# Patient Record
Sex: Female | Born: 1995 | Race: Black or African American | Hispanic: No | Marital: Single | Smoking: Never smoker
Health system: Southern US, Community
[De-identification: ages and names within clinical notes are randomized; demographics above are authoritative.]

## PROBLEM LIST (undated history)

## (undated) DIAGNOSIS — E559 Vitamin D deficiency, unspecified: Secondary | ICD-10-CM

## (undated) DIAGNOSIS — R519 Headache, unspecified: Secondary | ICD-10-CM

## (undated) DIAGNOSIS — F32A Depression, unspecified: Secondary | ICD-10-CM

## (undated) DIAGNOSIS — K429 Umbilical hernia without obstruction or gangrene: Secondary | ICD-10-CM

## (undated) DIAGNOSIS — Z8719 Personal history of other diseases of the digestive system: Secondary | ICD-10-CM

## (undated) DIAGNOSIS — J45909 Unspecified asthma, uncomplicated: Secondary | ICD-10-CM

## (undated) DIAGNOSIS — F419 Anxiety disorder, unspecified: Secondary | ICD-10-CM

## (undated) DIAGNOSIS — K219 Gastro-esophageal reflux disease without esophagitis: Secondary | ICD-10-CM

## (undated) HISTORY — PX: TONSILLECTOMY: SUR1361

## (undated) HISTORY — PX: WISDOM TOOTH EXTRACTION: SHX21

---
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
## 2018-02-27 DIAGNOSIS — M722 Plantar fascial fibromatosis: Secondary | ICD-10-CM | POA: Insufficient documentation

## 2018-03-19 DIAGNOSIS — M228X2 Other disorders of patella, left knee: Secondary | ICD-10-CM | POA: Insufficient documentation

## 2018-09-17 HISTORY — PX: KNEE ARTHROSCOPY W/ LATERAL RELEASE: SHX1873

## 2018-10-18 DIAGNOSIS — F339 Major depressive disorder, recurrent, unspecified: Secondary | ICD-10-CM | POA: Insufficient documentation

## 2021-12-12 ENCOUNTER — Ambulatory Visit (INDEPENDENT_AMBULATORY_CARE_PROVIDER_SITE_OTHER): Payer: Medicaid Other | Admitting: Gastroenterology

## 2021-12-12 ENCOUNTER — Encounter: Payer: Self-pay | Admitting: Gastroenterology

## 2021-12-12 ENCOUNTER — Other Ambulatory Visit: Payer: Self-pay

## 2021-12-12 VITALS — BP 128/84 | HR 73 | Temp 98.7°F | Ht 62.0 in | Wt 192.0 lb

## 2021-12-12 DIAGNOSIS — K219 Gastro-esophageal reflux disease without esophagitis: Secondary | ICD-10-CM | POA: Diagnosis not present

## 2021-12-12 MED ORDER — PANTOPRAZOLE SODIUM 40 MG PO TBEC: 40.0000 mg | DELAYED_RELEASE_TABLET | Freq: Every day | ORAL | 3 refills | Status: DC

## 2021-12-12 NOTE — Progress Notes (Signed)
? ? ?Gastroenterology Consultation ? ?Referring Provider:     Ronal Fear, PA-C ?Primary Care Physician:  No primary care provider on file. ?Primary Gastroenterologist:  Dr. Servando Snare     ?Reason for Consultation:     GERD ?      ? HPI:   ?April Foster is a 26 y.o. y/o female referred for consultation & management of GERD. by Dr. Bonnetta Barry primary care provider on file..  This patient comes in to see me today after being seen in the past that [facility] for GERD.  The patient underwent an upper endoscopy without any significant findings.  The patient reports that she has symptoms of regurgitation with burning of her mouth and heartburn.  The patient was put on omeprazole with very little relief.  The patient also states that she was told she had a hiatal hernia.  There is no report of any black stools or bloody stools.  The patient also denies any unexplained weight loss.  She does report that some of her symptoms are worse when she lays down at night.  The patient felt that she was not taken seriously with her previous gastroenterologist.  The patient denies any food getting stuck in her esophagus or choking when she eats. ? ?No past medical history on file. ? ? ? ?Prior to Admission medications   ?Medication Sig Start Date End Date Taking? Authorizing Provider  ?ARIPiprazole (ABILIFY) 2 MG tablet Take 2 mg by mouth at bedtime. 10/15/21  Yes [provider]  ?FLUoxetine (PROZAC) 40 MG capsule Take 40 mg by mouth daily. 10/15/21  Yes [provider]  ?norethindrone-ethinyl estradiol-iron (LOESTRIN FE) 1.5-30 MG-MCG tablet Take by mouth.   Yes [provider]  ? ? ?No family history on file.  ? ?Social History  ? ?Tobacco Use  ? Smoking status: Never  ? Smokeless tobacco: Never  ? ? ?Allergies as of 12/12/2021 - Review Complete 12/12/2021  ?Allergen Reaction Noted  ? Cholecalciferol Rash 03/18/2019  ? Bupropion Hives 02/18/2019  ? Ibuprofen Nausea And Vomiting 06/06/2016   ? ? ?Review of Systems:    ?All systems reviewed and negative except where noted in HPI. ? ? Physical Exam:  ?BP 128/84   Pulse 73   Temp 98.7 ?F (37.1 ?C) (Oral)   Ht 5\' 2"  (1.575 m)   Wt 192 lb (87.1 kg)   BMI 35.12 kg/m?  ?No LMP recorded. ?General:   Alert,  Well-developed, well-nourished, pleasant and cooperative in NAD ?Head:  Normocephalic and atraumatic. ?Eyes:  Sclera clear, no icterus.   Conjunctiva pink. ?Ears:  Normal auditory acuity. ?Neck:  Supple; no masses or thyromegaly. ?Lungs:  Respirations even and unlabored.  Clear throughout to auscultation.   No wheezes, crackles, or rhonchi. No acute distress. ?Heart:  Regular rate and rhythm; no murmurs, clicks, rubs, or gallops. ?Abdomen:  Normal bowel sounds.  No bruits.  Soft, Mild epigastric tenderness and non-distended without masses, hepatosplenomegaly or hernias noted.  No guarding or rebound tenderness.  Negative Carnett sign.   ?Rectal:  Deferred.  ?Pulses:  Normal pulses noted. ?Extremities:  No clubbing or edema.  No cyanosis. ?Neurologic:  Alert and oriented x3;  grossly normal neurologically. ?Skin:  Intact without significant lesions or rashes.  No jaundice. ?Lymph Nodes:  No significant cervical adenopathy. ?Psych:  Alert and cooperative. Normal mood and affect. ? ?Imaging Studies: ?No results found. ? ?Assessment and Plan:  ? ?April Foster is a 26 y.o. y/o female who comes in today with  a history of having an EGD with a hiatal hernia and reports that she is not doing any better on her omeprazole.  The patient will be started on pantoprazole 40 mg a day.  The patient has been told that if her symptoms are worse at night she should take the pantoprazole in the evening.  The patient has also been told that she does not need a repeat EGD at this time. If her symptoms do not improve on the PPI then she may need esophageal pH monitoring to see if she is actually having acid reflux despite taking medication.  If she does get relief from the  PPI she has also been told that she may elect to follow up with surgery for possible antireflux surgery.  The patient has been explained the plan and agrees with it. ? ? ? ?Midge Minium, MD. Clementeen Graham ? ? ? Note: This dictation was prepared with Dragon dictation along with smaller phrase technology. Any transcriptional errors that result from this process are unintentional.   ?

## 2021-12-27 ENCOUNTER — Encounter: Payer: Self-pay | Admitting: Gastroenterology

## 2021-12-27 DIAGNOSIS — K219 Gastro-esophageal reflux disease without esophagitis: Secondary | ICD-10-CM

## 2022-01-03 ENCOUNTER — Telehealth: Payer: Self-pay | Admitting: Gastroenterology

## 2022-01-03 NOTE — Telephone Encounter (Signed)
Patient called stating that she had left Dr Servando Snare a message in [place] on 12/27/2021 and has not received a reply. Patient is requesting a call back from Dr Servando Snare or his nurse as soon as possible.  ?

## 2022-01-04 NOTE — Telephone Encounter (Signed)
Please see [email or web address]

## 2022-01-22 NOTE — Addendum Note (Signed)
Addended by: Roena Malady on: 01/22/2022 09:36 AM ? ? Modules accepted: Orders ? ?

## 2022-01-30 ENCOUNTER — Other Ambulatory Visit: Payer: Self-pay

## 2022-01-30 ENCOUNTER — Emergency Department
Admission: EM | Admit: 2022-01-30 | Discharge: 2022-01-30 | Disposition: A | Discharge status: Home / Self Care | Payer: Medicaid Other | Attending: Emergency Medicine | Admitting: Emergency Medicine

## 2022-01-30 ENCOUNTER — Emergency Department: Payer: Medicaid Other

## 2022-01-30 DIAGNOSIS — R1013 Epigastric pain: Secondary | ICD-10-CM | POA: Insufficient documentation

## 2022-01-30 DIAGNOSIS — R112 Nausea with vomiting, unspecified: Secondary | ICD-10-CM | POA: Diagnosis not present

## 2022-01-30 LAB — COMPREHENSIVE METABOLIC PANEL
ALT: 23 U/L (ref 0–44)
AST: 26 U/L (ref 15–41)
Albumin: 4.2 g/dL (ref 3.5–5.0)
Alkaline Phosphatase: 76 U/L (ref 38–126)
Anion gap: 9 (ref 5–15)
BUN: 16 mg/dL (ref 6–20)
CO2: 23 mmol/L (ref 22–32)
Calcium: 9.2 mg/dL (ref 8.9–10.3)
Chloride: 107 mmol/L (ref 98–111)
Creatinine, Ser: 0.9 mg/dL (ref 0.44–1.00)
GFR, Estimated: >60 mL/min (ref >60)
Glucose, Bld: 106 mg/dL — ABNORMAL HIGH (ref 70–99)
Potassium: 4.1 mmol/L (ref 3.5–5.1)
Sodium: 139 mmol/L (ref 135–145)
Total Bilirubin: 0.8 mg/dL (ref 0.3–1.2)
Total Protein: 7.6 g/dL (ref 6.5–8.1)

## 2022-01-30 LAB — CBC WITH DIFFERENTIAL/PLATELET
Abs Immature Granulocytes: 0.03 10*3/uL (ref 0.00–0.07)
Basophils Absolute: 0 10*3/uL (ref 0.0–0.1)
Basophils Relative: 0 %
Eosinophils Absolute: 0.1 10*3/uL (ref 0.0–0.5)
Eosinophils Relative: 1 %
HCT: 43.9 % (ref 36.0–46.0)
Hemoglobin: 14.9 g/dL (ref 12.0–15.0)
Immature Granulocytes: 0 %
Lymphocytes Relative: 7 %
Lymphs Abs: 0.7 10*3/uL (ref 0.7–4.0)
MCH: 29.5 pg (ref 26.0–34.0)
MCHC: 33.9 g/dL (ref 30.0–36.0)
MCV: 86.9 fL (ref 80.0–100.0)
Monocytes Absolute: 0.8 10*3/uL (ref 0.1–1.0)
Monocytes Relative: 8 %
Neutro Abs: 8.7 10*3/uL — ABNORMAL HIGH (ref 1.7–7.7)
Neutrophils Relative %: 84 %
Platelets: 321 10*3/uL (ref 150–400)
RBC: 5.05 MIL/uL (ref 3.87–5.11)
RDW: 11.7 % (ref 11.5–15.5)
WBC: 10.2 10*3/uL (ref 4.0–10.5)
nRBC: 0 % (ref 0.0–0.2)

## 2022-01-30 LAB — TROPONIN I (HIGH SENSITIVITY): Troponin I (High Sensitivity): <2 ng/L (ref <18)

## 2022-01-30 LAB — URINALYSIS, ROUTINE W REFLEX MICROSCOPIC
Bilirubin Urine: NEGATIVE
Glucose, UA: NEGATIVE mg/dL
Hgb urine dipstick: NEGATIVE
Ketones, ur: NEGATIVE mg/dL
Nitrite: NEGATIVE
Protein, ur: NEGATIVE mg/dL
Specific Gravity, Urine: 1.021 (ref 1.005–1.030)
pH: 8 (ref 5.0–8.0)

## 2022-01-30 LAB — HCG, QUANTITATIVE, PREGNANCY: hCG, Beta Chain, Quant, S: <1 m[IU]/mL (ref <5)

## 2022-01-30 LAB — LIPASE, BLOOD: Lipase: 28 U/L (ref 11–51)

## 2022-01-30 LAB — POC URINE PREG, ED: Preg Test, Ur: NEGATIVE

## 2022-01-30 MED ORDER — SODIUM CHLORIDE 0.9 % IV BOLUS
1000.0000 mL | Freq: Once | INTRAVENOUS | Status: AC
  Administered 2022-01-30: 1000 mL via INTRAVENOUS

## 2022-01-30 MED ORDER — OMEPRAZOLE MAGNESIUM 20 MG PO TBEC: 20.0000 mg | DELAYED_RELEASE_TABLET | Freq: Every day | ORAL | 1 refills | Status: DC

## 2022-01-30 MED ORDER — MORPHINE SULFATE (PF) 4 MG/ML IV SOLN
4.0000 mg | Freq: Once | INTRAVENOUS | Status: AC
  Administered 2022-01-30: 4 mg via INTRAVENOUS
  Filled 2022-01-30: qty 1

## 2022-01-30 MED ORDER — MORPHINE SULFATE (PF) 4 MG/ML IV SOLN
4.0000 mg | Freq: Once | INTRAVENOUS | Status: AC
Start: 2022-01-30 — End: 2022-01-30
  Administered 2022-01-30: 4 mg via INTRAVENOUS
  Filled 2022-01-30: qty 1

## 2022-01-30 MED ORDER — ALUM & MAG HYDROXIDE-SIMETH 200-200-20 MG/5ML PO SUSP
30.0000 mL | Freq: Once | ORAL | Status: AC
  Administered 2022-01-30: 30 mL via ORAL
  Filled 2022-01-30: qty 30

## 2022-01-30 MED ORDER — LIDOCAINE VISCOUS HCL 2 % MT SOLN
15.0000 mL | Freq: Once | OROMUCOSAL | Status: AC
  Administered 2022-01-30: 15 mL via ORAL
  Filled 2022-01-30: qty 15

## 2022-01-30 MED ORDER — ONDANSETRON HCL 4 MG/2ML IJ SOLN
4.0000 mg | Freq: Once | INTRAMUSCULAR | Status: AC
  Administered 2022-01-30: 4 mg via INTRAVENOUS
  Filled 2022-01-30: qty 2

## 2022-01-30 MED ORDER — HYDROCODONE-ACETAMINOPHEN 5-325 MG PO TABS: 1.0000 | ORAL_TABLET | Freq: Four times a day (QID) | ORAL | 0 refills | Status: DC | PRN

## 2022-01-30 MED ORDER — IOHEXOL 300 MG/ML  SOLN
100.0000 mL | Freq: Once | INTRAMUSCULAR | Status: AC | PRN
  Administered 2022-01-30: 100 mL via INTRAVENOUS

## 2022-01-30 NOTE — ED Notes (Signed)
Patient requesting to speak with MD before leaving. MD made aware.  ?

## 2022-01-30 NOTE — ED Notes (Signed)
Pt agrees to notify this RN when urine sample available.  ?

## 2022-01-30 NOTE — ED Triage Notes (Signed)
Pt comes with c/o abdominal pain that started at 3am. Pt states vomiting twice today already. Pt denies any diarrhea. ?

## 2022-01-30 NOTE — ED Notes (Signed)
EDP Malinda verbal order to hold on IV and blood-work until after pt has tried GI cocktail.  ?

## 2022-01-30 NOTE — ED Notes (Signed)
Called lab to add on preg test. State they'll do so now.  ?

## 2022-01-30 NOTE — ED Provider Notes (Signed)
? ?[facility] ?Provider Note ? ? ? Event Date/Time  ? First MD Initiated Contact with Patient 01/30/22 [phone]   ?  (approximate) ? ? ?History  ? ?Abdominal Pain ? ? ?HPI ? ?Mystique Sien is a 26 y.o. female reports onset of upper abdominal epigastric pain about 3:00 this morning with nausea and vomiting but no diarrhea.  The patient is severe and radiates to her back.  It is made worse by the bumps in the road and percussion of her CVA areas.  GI cocktail was given but did not get rid of the pain.  Patient does seem to be less restless however though. ?  ? ? ?Physical Exam  ? ?Triage Vital Signs: ?ED Triage Vitals  ?Enc Vitals Group  ?   BP 01/30/22 0853 120/82  ?   Pulse Rate 01/30/22 0853 (!) 106  ?   Resp 01/30/22 0853 20  ?   Temp 01/30/22 0853 98.1 ?F (36.7 ?C)  ?   Temp Source 01/30/22 0853 Oral  ?   SpO2 01/30/22 0853 97 %  ?   Weight 01/30/22 0853 190 lb (86.2 kg)  ?   Height 01/30/22 0853 5\' 2"  (1.575 m)  ?   Head Circumference --   ?   Peak Flow --   ?   Pain Score 01/30/22 0836 8  ?   Pain Loc --   ?   Pain Edu? --   ?   Excl. in [place]? --   ? ? ?Most recent vital signs: ?Vitals:  ? 01/30/22 1215 01/30/22 1351  ?BP:  110/68  ?Pulse: 92 90  ?Resp: (!) 21 17  ?Temp:  98.4 ?F (36.9 ?C)  ?SpO2: 99% 98%  ? ? ? ?General: Awake, alert, looks uncomfortable ?CV:  Good peripheral perfusion.  Heart regular rate and rhythm no audible murmurs ?Resp:  Normal effort.  Lungs are clear ?Abd:  No distention.  There is diffuse tenderness which is worse in the epigastric area but it is tender throughout with tender to palpation and percussion there is no CVA Khtari tenderness but percussion of the CVA areas causes anterior abdominal pain ?Extremities no edema ? ? ?ED Results / Procedures / Treatments  ? ?Labs ?(all labs ordered are listed, but only abnormal results are displayed) ?Labs Reviewed  ?COMPREHENSIVE METABOLIC PANEL - Abnormal; Notable for the following components:  ?    Result Value  ? Glucose,  Bld 106 (*)   ? All other components within normal limits  ?URINALYSIS, ROUTINE W REFLEX MICROSCOPIC - Abnormal; Notable for the following components:  ? Color, Urine YELLOW (*)   ? APPearance HAZY (*)   ? Leukocytes,Ua TRACE (*)   ? Bacteria, UA RARE (*)   ? All other components within normal limits  ?CBC WITH DIFFERENTIAL/PLATELET - Abnormal; Notable for the following components:  ? Neutro Abs 8.7 (*)   ? All other components within normal limits  ?LIPASE, BLOOD  ?HCG, QUANTITATIVE, PREGNANCY  ?POC URINE PREG, ED  ?TROPONIN I (HIGH SENSITIVITY)  ?TROPONIN I (HIGH SENSITIVITY)  ? ? ? ?EKG ? ?EKG read and interpreted by me shows sinus tachycardia rate of 109 normal axis nonspecific ST-T wave changes throughout. ? ? ?RADIOLOGY ?CT read by radiology films reviewed and interpreted by me does not show any acute disease.  Radiologist saw some fluid in the esophagus. ? ? ?PROCEDURES: ? ?Critical Care performed:  ? ?Procedures ? ? ?MEDICATIONS ORDERED IN ED: ?Medications  ?alum & mag hydroxide-simeth (  MAALOX/MYLANTA) 200-200-20 MG/5ML suspension 30 mL (30 mLs Oral Given 01/30/22 0917)  ?  And  ?lidocaine (XYLOCAINE) 2 % viscous mouth solution 15 mL (15 mLs Oral Given 01/30/22 0917)  ?morphine (PF) 4 MG/ML injection 4 mg (4 mg Intravenous Given 01/30/22 0938)  ?ondansetron [facility]) injection 4 mg (4 mg Intravenous Given 01/30/22 0936)  ?sodium chloride 0.9 % bolus 1,000 mL (0 mLs Intravenous Stopped 01/30/22 1350)  ?morphine (PF) 4 MG/ML injection 4 mg (4 mg Intravenous Given 01/30/22 1106)  ?iohexol (OMNIPAQUE) 300 MG/ML solution 100 mL (100 mLs Intravenous Contrast Given 01/30/22 1119)  ? ? ? ?IMPRESSION / MDM / ASSESSMENT AND PLAN / ED COURSE  ?I reviewed the triage vital signs and the nursing notes. ?Patient's lab work looks normal CT looks normal pain could possibly be from an ulcer.  I could give the patient normal blood count white blood count lipase liver functions lites etc. she has a normal CT and still because good bit  of pain.  I explained this in detail to the patient family.  I detailed how she had no signs of infection and that there are no abscesses pancreas problems gallbladder problems liver problems etc.  She is already on medicine for acid blocker by GI doctor.  I will have her follow-up with the GI doc continue that medicine I given her a little bit of Vicodin may be for severe pain.  Patient will return for increasing pain fever vomiting diarrhea or any other problems.  Patient follow-up with her GI doctor as well. ? ? ? ? ? ? ?FINAL CLINICAL IMPRESSION(S) / ED DIAGNOSES  ? ?Final diagnoses:  ?Epigastric pain  ? ? ? ?Rx / DC Orders  ? ?ED Discharge Orders   ? ?      Ordered  ?  omeprazole (PRILOSEC OTC) 20 MG tablet  Daily,   Status:  Discontinued       ? 01/30/22 1220  ?  HYDROcodone-acetaminophen (NORCO/VICODIN) 5-325 MG tablet  Every 6 hours PRN       ? 01/30/22 1220  ? ?  ?  ? ?  ? ? ? ?Note:  This document was prepared using Dragon voice recognition software and may include unintentional dictation errors. ?  ?Nena Polio, MD ?01/30/22 1436 ? ?

## 2022-01-30 NOTE — ED Notes (Signed)
This tech attempted to draw blood for lab work, Pt began to hyperventilate and move around in chair. Pt inconsolable. This tech informed Pt that the blood work can be postponed until she is in a room. Pt took two puffs of her inhaler and breathing controlled at this time. First Nurse Sam, RN notified and Pt taken to room 18 by this tech.  ?

## 2022-01-30 NOTE — ED Notes (Signed)
Pt up to bedside toilet to provide urine sample.  

## 2022-01-30 NOTE — ED Notes (Signed)
Cardiac monitor applied for accuracy as pulse ox has good waveform for O2 sat but pulse reading sometimes 25-35 then other times in the 250s. Have adjusted pulse ox and it continues to do this while pt is completely relaxed on stretcher. Pt in NSR to ST from 97-105HR on monitor currently.  ?

## 2022-01-30 NOTE — Discharge Instructions (Addendum)
The CAT scan and blood work were normal.  Not sure what is causing the pain.  You may had an ulcer going on.  That would fit the symptoms.  I will give you some pain medication.  Please continue the Protonix that Dr. Daleen Squibb gave you.  Please return for increasing pain or fever or vomiting or if you get lightheaded or feel sicker.  Otherwise please follow-up with your doctor and the gastroenterologist.  Give Dr.Wohl's call and let them know you are having the pain.  They should be able to see you again soon. ?

## 2022-01-30 NOTE — ED Notes (Signed)
Visitors remain with pt; pt's call bell within reach; pt educated about call bell; rail up; stretcher locked low.  ?

## 2022-01-30 NOTE — ED Notes (Signed)
Pt in with throbbing/sharp upper L/R and lower R abdominal pain; reports is tender; is at hernia per pt; has appointment to meet with surgical team soon per pt but was unable to withstand discomfort until then; pt has emesis bag; no vomiting currently noted; pt denies fever; pt's skin dry; pt's resp reg/unlabored; pt reports pain has been constant since it started; pt has fear of needles and states this is why blood hasn't been obtained yet; pt educated about need for blood-work; visitor with pt.  ?

## 2022-01-30 NOTE — ED Notes (Signed)
Pt states "8/10" pain currently; skin dry; resp reg/unlabored; calmly sitting on stretcher; having conversation with visitors in which she is making jokes; in NAD.  ?

## 2022-01-31 ENCOUNTER — Ambulatory Visit (INDEPENDENT_AMBULATORY_CARE_PROVIDER_SITE_OTHER): Payer: Medicaid Other | Admitting: Surgery

## 2022-01-31 ENCOUNTER — Encounter: Payer: Self-pay | Admitting: Surgery

## 2022-01-31 VITALS — BP 110/76 | HR 85 | Temp 98.5°F | Wt 184.4 lb

## 2022-01-31 DIAGNOSIS — R1013 Epigastric pain: Secondary | ICD-10-CM

## 2022-01-31 DIAGNOSIS — K449 Diaphragmatic hernia without obstruction or gangrene: Secondary | ICD-10-CM | POA: Diagnosis not present

## 2022-01-31 DIAGNOSIS — R1011 Right upper quadrant pain: Secondary | ICD-10-CM

## 2022-01-31 NOTE — Patient Instructions (Addendum)
Your Barium Swallow is scheduled for 01/06/2022 at 9:00 am (arrive by 8:45 am) @ [facility].  ? ?Your Ultrasound is scheduled for 02/09/2022 at 9:00 am (arrive by 8:45 am) @ the [facility] on [address]. Nothing to eat or drink after Midnight.  ? ? ?Centralized Scheduling: [phone]  ? ? ?A referral has been placed to [facility] for an Endoscopy. They will call you for an appointment.  ? ?If you have any concerns or questions, please feel free to call our office. See follow up appointment below.  ? ?Hiatal Hernia ? ?A hiatal hernia occurs when part of the stomach slides above the muscle that separates the abdomen from the chest (diaphragm). A person can be born with a hiatal hernia (congenital), or it may develop over time. In almost all cases of hiatal hernia, only the top part of the stomach pushes through the diaphragm. ?Many people have a hiatal hernia with no symptoms. The larger the hernia, the more likely it is that you will have symptoms. In some cases, a hiatal hernia allows stomach acid to flow back into the tube that carries food from your mouth to your stomach (esophagus). This may cause heartburn symptoms. Severe heartburn symptoms may mean that you have developed a condition called gastroesophageal reflux disease (GERD). ?What are the causes? ?This condition is caused by a weakness in the opening (hiatus) where the esophagus passes through the diaphragm to attach to the upper part of the stomach. A person may be born with a weakness in the hiatus, or a weakness can develop over time. ?What increases the risk? ?This condition is more likely to develop in: ?Older people. Age is a major risk factor for a hiatal hernia, especially if you are over the age of 28. ?Pregnant women. ?People who are overweight. ?People who have frequent constipation. ?What are the signs or symptoms? ?Symptoms of this condition usually develop in the form of GERD symptoms. Symptoms  include: ?Heartburn. ?Belching. ?Indigestion. ?Trouble swallowing. ?Coughing or wheezing. ?Sore throat. ?Hoarseness. ?Chest pain. ?Nausea and vomiting. ?How is this diagnosed? ?This condition may be diagnosed during testing for GERD. Tests that may be done include: ?X-rays of your stomach or chest. ?An upper gastrointestinal (GI) series. This is an X-ray exam of your GI tract that is taken after you swallow a chalky liquid that shows up clearly on the X-ray. ?Endoscopy. This is a procedure to look into your stomach using a thin, flexible tube that has a tiny camera and light on the end of it. ?How is this treated? ?This condition may be treated by: ?Dietary and lifestyle changes to help reduce GERD symptoms. ?Medicines. These may include: ?Over-the-counter antacids. ?Medicines that make your stomach empty more quickly. ?Medicines that block the production of stomach acid (H2 blockers). ?Stronger medicines to reduce stomach acid (proton pump inhibitors). ?Surgery to repair the hernia, if other treatments are not helping. ?If you have no symptoms, you may not need treatment. ?Follow these instructions at home: ?Lifestyle and activity ?Do not use any products that contain nicotine or tobacco, such as cigarettes and e-cigarettes. If you need help quitting, ask your health care provider. ?Try to achieve and maintain a healthy body weight. ?Avoid putting pressure on your abdomen. Anything that puts pressure on your abdomen increases the amount of acid that may be pushed up into your esophagus. ?Avoid bending over, especially after eating. ?Raise the head of your bed by putting blocks under the legs. This keeps your head and esophagus  higher than your stomach. ?Do not wear tight clothing around your chest or stomach. ?Try not to strain when having a bowel movement, when urinating, or when lifting heavy objects. ?Eating and drinking ?Avoid foods that can worsen GERD symptoms. These may include: ?Fatty foods, like fried  foods. ?Citrus fruits, like oranges or lemon. ?Other foods and drinks that contain acid, like orange juice or tomatoes. ?Spicy food. ?Chocolate. ?Eat frequent small meals instead of three large meals a day. This helps prevent your stomach from getting too full. ?Eat slowly. ?Do not lie down right after eating. ?Do not eat 1-2 hours before bed. ?Do not drink beverages with caffeine. These include cola, coffee, cocoa, and tea. ?Do not drink alcohol. ?General instructions ?Take over-the-counter and prescription medicines only as told by your health care provider. ?Keep all follow-up visits as told by your health care provider. This is important. ?Contact a health care provider if: ?Your symptoms are not controlled with medicines or lifestyle changes. ?You are having trouble swallowing. ?You have coughing or wheezing that will not go away. ?Get help right away if: ?Your pain is getting worse. ?Your pain spreads to your arms, neck, jaw, teeth, or back. ?You have shortness of breath. ?You sweat for no reason. ?You feel sick to your stomach (nauseous) or you vomit. ?You vomit blood. ?You have bright red blood in your stools. ?You have black, tarry stools. ?Summary ?A hiatal hernia occurs when part of the stomach slides above the muscle that separates the abdomen from the chest (diaphragm). ?A person may be born with a weakness in the hiatus, or a weakness can develop over time. ?Symptoms of hiatal hernia may include heartburn, trouble swallowing, or sore throat. ?Management of hiatal hernia includes eating frequent small meals instead of three large meals a day. ?Get help right away if you vomit blood, have bright red blood in your stools, or have black, tarry stools. ?This information is not intended to replace advice given to you by your health care provider. Make sure you discuss any questions you have with your health care provider. ?Document Revised: 07/18/2021 Document Reviewed: 08/04/2020 ?Elsevier Patient Education  ? 2023 Elsevier Inc. ? ?

## 2022-02-01 ENCOUNTER — Telehealth: Payer: Self-pay | Admitting: Surgery

## 2022-02-01 NOTE — Progress Notes (Signed)
Patient ID: April Foster, female   DOB: [birth year], 26 y.o.   MRN: [id number]  HPI April Foster is a 26 y.o. female seen in consultation at the request of Dr. Servando Snare.  For hiatal hernia and significant reflux.  Please note that GI has placed her on PPI with improvement of reflux symptoms.  She experiences heartburn that worsen when she lies supine.  Most recently couple days ago she went to the emergency room with severe epigastric pain, she thought this worse related to her hernia.  Pain woke her from sleep and was associated with nausea.  Pain lasted a few hours and think Better after receiving narcotics.  She also got a GI cocktail.  Pain was severe and sharp.  Work-up in the ER including a CT scan of the abdomen pelvis that have personally reviewed showing evidence of a small sliding hiatal hernia. Of note she did have an EGD about a year or so ago at outside facility [facility])  revealing evidence of a hiatal hernia. Her CBC, lipase and CMP were completely normal She is otherwise healthy and is able to perform more than 4 METS of activity without any shortness of breath or chest pain. SHe comes accompanied by mother who has raised her.   HPI  PMHx GERD  No past surgical history on file.  Fam HX significant for GB issues grandmother and other family members.  Social History Social History   Tobacco Use   Smoking status: Never   Smokeless tobacco: Never    Allergies  Allergen Reactions   Cholecalciferol Rash   Bupropion Hives   Ibuprofen Nausea And Vomiting    Current Outpatient Medications  Medication Sig Dispense Refill   ARIPiprazole (ABILIFY) 2 MG tablet Take 2 mg by mouth at bedtime.     FLUoxetine (PROZAC) 40 MG capsule Take 40 mg by mouth daily.     HYDROcodone-acetaminophen (NORCO/VICODIN) 5-325 MG tablet Take 1 tablet by mouth every 6 (six) hours as needed for moderate pain. 10 tablet 0   norethindrone-ethinyl estradiol-iron (LOESTRIN FE) 1.5-30 MG-MCG tablet Take by mouth.      pantoprazole (PROTONIX) 40 MG tablet Take 1 tablet (40 mg total) by mouth daily. 30 tablet 3   No current facility-administered medications for this visit.     Review of Systems Full ROS  was asked and was negative except for the information on the HPI  Physical Exam Blood pressure 110/76, pulse 85, temperature 98.5 F (36.9 C), weight 184 lb 6.4 oz (83.6 kg), SpO2 100 %. CONSTITUTIONAL: NAD. EYES: Pupils are equal, round,  Sclera are non-icteric. EARS, NOSE, MOUTH AND THROAT:  The oral mucosa is pink and moist. Hearing is intact to voice. LYMPH NODES:  Lymph nodes in the neck are normal. RESPIRATORY:  Lungs are clear. There is normal respiratory effort, with equal breath sounds bilaterally, and without pathologic use of accessory muscles. CARDIOVASCULAR: Heart is regular without murmurs, gallops, or rubs. GI: The abdomen is  soft, nontender, and nondistended. There are no palpable masses. There is no hepatosplenomegaly. There are normal bowel sounds in all quadrants. GU: Rectal deferred.   MUSCULOSKELETAL: Normal muscle strength and tone. No cyanosis or edema.   SKIN: Turgor is good and there are no pathologic skin lesions or ulcers. NEUROLOGIC: Motor and sensation is grossly normal. Cranial nerves are grossly intact. PSYCH:  Oriented to person, place and time. Affect is normal.  Data Reviewed  I have personally reviewed the patient's imaging, laboratory findings and medical records.  Assessment/Plan 26 year old female with epigastric pain symptoms more consistent with biliary colic.  In addition she has have evidence of a hiatal hernia with reflux but I do think that they are 2 separate issues. I would like to start further work-up with right upper quadrant ultrasound to evaluate gallbladder and stones.  She does have a strong family history of gallstones. Currently she is not toxic nor septic and does not need to be hospitalized.  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  Discussed with her diet modification and exercise to optimize her BMI as well. I will see her back when she completes her work-up. I spent 60 minutes in this encounter including personally reviewing imaging studies, medical records, counseling the patient and family, placing orders and performing appropriate patient  Sterling Big, MD FACS General Surgeon 02/01/2022, 7:21 AM

## 2022-02-01 NOTE — Telephone Encounter (Signed)
The patient was referred to [facility] just to have an upper endoscopy done for work up prior to hiatal hernia surgery. I have sent them a message about this to get the patient in sooner to have this done.

## 2022-02-01 NOTE — Telephone Encounter (Signed)
Patient was seen on 01/31/22 for hiatal hernia.  We had placed a referral for her to go back to [facility] for additional workup. Patient states that she was just referred to [place] from them and is not quite sure why she needs to go back.  She further states the soonest appointment they have in [place] is in August and [place] is further out from that.  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  Please call her.  Thank you.

## 2022-02-05 ENCOUNTER — Ambulatory Visit: Payer: Medicaid Other

## 2022-02-09 ENCOUNTER — Ambulatory Visit: Payer: Medicaid Other

## 2022-02-09 ENCOUNTER — Ambulatory Visit
Admission: RE | Admit: 2022-02-09 | Discharge: 2022-02-09 | Disposition: A | Discharge status: Home / Self Care | Payer: Medicaid Other | Source: Ambulatory Visit | Attending: Surgery | Admitting: Surgery

## 2022-02-09 DIAGNOSIS — R1011 Right upper quadrant pain: Secondary | ICD-10-CM | POA: Insufficient documentation

## 2022-02-09 DIAGNOSIS — K449 Diaphragmatic hernia without obstruction or gangrene: Secondary | ICD-10-CM | POA: Diagnosis present

## 2022-02-13 ENCOUNTER — Telehealth: Payer: Self-pay

## 2022-02-13 NOTE — Telephone Encounter (Signed)
Left message that scan showed known hital hernia-keep scheduled appointment with Dr.Pabon.

## 2022-02-21 ENCOUNTER — Encounter: Payer: Self-pay | Admitting: Surgery

## 2022-02-21 ENCOUNTER — Ambulatory Visit: Payer: Medicaid Other | Admitting: Surgery

## 2022-02-21 VITALS — BP 115/80 | HR 83 | Temp 98.4°F | Ht 62.0 in | Wt 188.0 lb

## 2022-02-21 DIAGNOSIS — K449 Diaphragmatic hernia without obstruction or gangrene: Secondary | ICD-10-CM

## 2022-02-21 NOTE — Patient Instructions (Signed)
We have spoken today about repairing your Hiatal Hernia. Your surgery has been scheduled for 03/29/2022 with Dr. Everlene Farrier.  Plan to be in the hospital for 1-2 days if the minimally invasive surgery is completed without having to make a bigger incision. If the bigger incision is made, you will most likely need to be in the hospital 4-6 days. You will be on a liquid diet and need to recover for 2 weeks following your surgery prior to doing any of your normal activities. At the 2 week mark, we will see you in the office and if you are doing ok we will advance your diet and activity level as you tolerate.  Please see your Blue (Pre-care) Sheet for more information regarding your surgery. Our surgery scheduler will call you to verify surgery date and to go over information  If you have FMLA or disability paperwork that needs filled out you may drop this off at our office or this can be faxed to [phone].  Please call our office with any questions or concerns that you have regarding your surgery and recovery.     Laparoscopic Nissen Fundoplication Laparoscopic Nissen fundoplication is surgery to relieve heartburn and other problems caused by gastric fluids flowing up into your esophagus. The esophagus is the tube that carries food and liquid from your throat to your stomach. Normally, the muscle that sits between your stomach and your esophagus (lower esophageal sphincter or LES) keeps stomach fluids in your stomach. In some people, the LES does not work properly, and stomach fluids flow up into the esophagus. This can happen when part of the stomach bulges through the LES (hiatal hernia). The backward flow of stomach fluids can cause a type of severe and long-standing heartburn that is called gastroesophageal reflux disease (GERD). You may need this surgery if other treatments for GERD have not helped. In a laparoscopic Nissen fundoplication, the upper part of your stomach is wrapped around the lower  part of your esophagus to strengthen the LES and prevent reflux. If you have a hiatal hernia, it will also be repaired with this surgery. The procedure is done through several small incisions in your abdomen. It is performed using a thin, telescopic instrument (laparoscope) and other instruments that can pass through the scope or through other small incisions. Tell a health care provider about: Any allergies you have. All medicines you are taking, including vitamins, herbs, eye drops, creams, and over-the-counter medicines. Any problems you or family members have had with anesthetic medicines. Any blood disorders you have. Any surgeries you have had. Any medical conditions you have. What are the risks? Generally, this is a safe procedure. However, problems may occur, including: Difficulty swallowing (dysphagia). Bloating. Nausea or vomiting. Damage to the lung, causing a collapsed lung. Infection or bleeding. What happens before the procedure? Ask your health care provider about: Changing or stopping your regular medicines. This is especially important if you are taking diabetes medicines or blood thinners. Taking medicines such as aspirin and ibuprofen. These medicines can thin your blood. Do not take these medicines before your procedure if your health care provider asks you not to. Follow your health care provider's instructions about eating or drinking restrictions. Plan to have someone take you home after the procedure. What happens during the procedure? An IV tube will be inserted into one of your veins. It will be used to give you fluids and medicines during the procedure. You will be given a medicine that makes you fall asleep (  general anesthetic). Your abdomen will be cleaned with a germ-killing solution (antiseptic). The surgeon will make a small incision in your abdomen and insert a tube through the incision. Your abdomen will be filled with a gas. This helps the surgeon to see  your organs more easily and it makes more space to work. The surgeon will insert the laparoscope through the incision. The scope has a camera that will send pictures to a monitor in the operating room. The surgeon will make several other small incisions in your abdomen to insert the other instruments that are needed during the procedure. Another instrument (dilator) will be passed through your mouth and down your esophagus into the upper part of your stomach. The dilator will prevent your LES from being closed too tightly during surgery. The surgeon will pass the top portion of your stomach behind the lower part of your esophagus and wrap it all the way around. This will be stitched into place. If you have a hiatal hernia, it will be repaired during this procedure. All instruments will be removed, and your incisions will be closed under your skin with stitches (sutures). Skin adhesive strips may also be used. A bandage (dressing) will be placed on your skin over the incisions. The procedure may vary among health care providers and hospitals. What happens after the procedure? You will be moved to a recovery area. Your blood pressure, heart rate, breathing rate, and blood oxygen level will be monitored often until the medicines you were given have worn off. You will be given pain medicine as needed. Your IV tube will be kept in until you are able to drink fluids. This information is not intended to replace advice given to you by your health care provider. Make sure you discuss any questions you have with your health care provider. Document Released: 09/24/2014 Document Revised: 02/09/2016 Document Reviewed: 05/05/2014 Elsevier Interactive Patient Education  2017 Else[employer].   Laparoscopic Nissen Fundoplication, Care After Refer to this sheet in the next few weeks. These instructions provide you with information about caring for yourself after your procedure. Your health care provider may also  give you more specific instructions. Your treatment has been planned according to current medical practices, but problems sometimes occur. Call your health care provider if you have any problems or questions after your procedure. What can I expect after the procedure? After the procedure, it is common to have: Difficulty swallowing (dysphagia). Excess gas (bloating). Follow these instructions at home: Medicines  Take medicines only as directed by your health care provider. Do not drive or operate heavy machinery while taking pain medicine. Incision care  There are many different ways to close and cover an incision, including stitches (sutures), skin glue, and adhesive strips. Follow your health care provider's instructions about: Incision care. Bandage (dressing) changes and removal. Incision closure removal. Check your incision areas every day for signs of infection. Watch for: Redness, swelling, or pain. Fluid, blood, or pus. Do not take baths, swim, or use a hot tub until your health care provider approves. Take showers as directed by your health care provider. Eating and drinking  Follow your health care provider's instructions about eating. You may need to follow a liquid-only diet for 2 weeks, followed by a diet of soft foods for 2 weeks. You should return to your usual diet gradually. Drink enough fluid to keep your urine clear or pale yellow. Activity  Return to your normal activities as directed by your health care provider. Ask your  health care provider what activities are safe for you. Avoid strenuous exercise. Do not lift anything that is heavier than 10 lb (4.5 kg). Ask your health care provider when you can: Return to sexual activity. Drive. Go back to work. Contact a health care provider if: You have a fever. Your pain gets worse or is not helped by medicine. You have frequent nausea or vomiting. You have continued abdominal bloating. You have an ongoing  (persistent) cough. You have redness, swelling, or pain in any incision areas. You have fluid, blood, or pus coming from any incisions. Get help right away if: You have trouble breathing. You are unable to swallow. You have persistent vomiting. You have blood in your vomit. You have severe abdominal pain. This information is not intended to replace advice given to you by your health care provider. Make sure you discuss any questions you have with your health care provider. Document Released: 04/26/2004 Document Revised: 02/09/2016 Document Reviewed: 05/05/2014 Elsevier Interactive Patient Education  2017 Elsevier Inc.   Diet After Nissen Fundoplication Surgery This diet information is for patients who have recently had Nissen fundoplication surgery to correct reflux disease or to repair various types of hernias, such as hiatal hernia and intrathoracic stomach. This diet may also be used for other gastrointestinal surgeries, such as Heller myotomy and repair of achalasia. The diet will help control diarrhea, excess gas and swallowing problems, which may occur after this type of surgery. Keeping Your Stomach from Stretching Eat small, frequent meals (six to eight per day). This will help you consume the majority of the nutrients you need without causing your stomach to feel full or distended.  Drinking large amounts of fluids with meals can stretch your stomach. You may drink fluids between meals as often as you like, but limit fluids to 1/2 cup (4 fluid ounces) with meals and one cup (8 fluid ounces) with snacks.  Sit upright while eating and stay upright for 30 minutes after each meal. Gravity can help food move through your digestive tract. Do not lie down after eating. Sit upright for 2 hours after your last meal or snack of the day.  Eat very slowly. Take your time when eating.  Take small bites and chew your food well to help aid in swallowing and digestion.  Avoid crusty breads and  sticky, gummy foods, such as bananas, fresh doughy breads, rolls and doughnuts. These types of foods become sticky and difficult to swallow.  Toasted breads tend to be better tolerated.  Lastly, if you eat sweets, consume them at the end of your meal to avoid a group of symptoms referred to as "dumping syndrome". This describes the rapid emptying of foods from the stomach to the small intestine. Sweetened beverages, candy and desserts move more rapidly and dump quickly into the intestines. This can cause symptoms of nausea, weakness, cold sweats, cramps, diarrhea and dizzy spells.  Avoiding Gas Avoid drinking through a straw. Do not chew gum or tobacco. These actions cause you to swallow air, which produces excess gas in your stomach. Chew with your mouth closed.  Avoid any foods that cause stomach gas and distention. These foods include corn, dried beans, peas, lentils, onions, broccoli, cauliflower and any food from the cabbage family.  Avoid carbonated drinks, alcohol, citrus and tomato products.  When will I be able to eat a soft diet? After Nissen fundoplication surgery, your diet will be advanced slowly by your surgeon. Generally, you will be on a clear liquid diet  for the first few meals. Then you will advance to the full liquid diet for a meal or two and eventually to a Nissen soft diet. Please be aware that each patient's tolerance to food is different. Your doctor will advance your diet depending on how well you progress after surgery. Clear Liquid Diet  The first diet after surgery is the clear liquid diet. It includes the following liquids: Apple juice  Cranberry juice  Grape juice  Chicken broth  Beef broth  Flavored gelatin (Jell-O)  Decaf tea and coffee  Caffeinated beverages are permitted based on tolerance  Popsicles  [place] ice Carbonated drinks (sodas) are not allowed for the first six to eight weeks after surgery. After this time you can try them again in small  amounts.  Full Liquid Diet The full liquid diet contains anything on the clear liquid diet, plus: Milk, soy, rice and almond (no chocolate)  Cream of wheat, cream of rice, grits  Strained creamed soups (no tomato or broccoli)  Vanilla and strawberry-flavored ice cream  Sherbet  Blended, custard styled or whipped yogurt (plain or vanilla only)  Vanilla and butterscotch pudding (no chocolate or coconut)  Nutritional drinks including Ensure, Boost, Carnation Instant Breakfast (no chocolate-flavored) Note: Dairy products, such as milk, ice cream and pudding, may cause diarrhea in some people just after surgery. You may need to avoid milk products. If so, substitute them with lactose-free beverages, such as soy, rice, Lactaid or almond milks.  Nissen Soft Diet Food Category Foods to Choose Foods to Avoid  Beverages Milk, such as, whole, 2%, 1%, non-fat, or skim, soy, rice, almond  Caffeinated and decaf tea and coffee  Powdered drink mixes (in moderation)  Non-citrus juices (apple, grape, cranberry or blends of these)  Fruit nectars  Nutritional drinks including Boost, Ensure, Carnation Instant Breakfast Chocolate milk, cocoa or other chocolate-flavored drinks  Carbonated drinks  Alcohol  Citrus juices like orange, grapefruit, lemon and lime  Breads Pancakes, [place] toast and waffles  Crackers (saltine, butter, soda, graham, Goldfish and Cheese Nips)  Toasted bread Untoasted bread, bagels, Kaiser and hard rolls, English muffins  Crackers with nuts, seeds, fresh or dried fruit, coconut, or highly seasoned, such as garlic or onion-flavored  Sweet rolls, coffee cake or doughnuts  Cereals Well cooked cereals, such as oatmeal (plain or flavored)  Cold cereal (Cornflakes, Rice Krispies, Cheerios, Special K plain, Rice Chex and puffed rice) Very coarse cereal, such as bran, shredded wheat  Any cereal with fresh or dried fruit, coconut, seeds or nuts  Desserts Eat in moderation and  do not eat desserts or sweets by themselves. Plain cakes, cookies and cream-filled pies  Vanilla and butterscotch pudding or custard  Ice cream, ice milk, frozen yogurt and sherbet  Gelatin made from allowed foods  Fruit ices and popsicles Desserts containing chocolate, coconut, nuts, seeds, fresh or dried fruit, peppermint or spearmint  Eggs  Poached, hard boiled or scrambled Fried eggs and highly seasoned eggs (deviled eggs)  Fats Eat in moderation. Butter and margarine  Mayonnaise and vegetable oils  Mildly seasoned cream sauces and gravies  Plain cream cheese  Sour cream Highly seasoned salad dressings, cream sauces and gravies  Bacon, bacon fat, ham fat, lard and salt pork  Fried foods  Nuts  Fruits Fruit juice  Any canned or cooked fruit except those listed in the AVOID column ALL fresh fruits, such as citrus, bananas and pineapple  Canned pineapple  Dried fruits, such as raisins, berries  Fruits with  seeds, such as berries, kiwi and figs  Meat, Fish, Poultry, and [employer] may be ground, minced or chopped to ease swallowing and digestion  Tender, well cooked and moist cuts of beef, chicken, [place] and pork  Veal and lamb  Flaky, cooked fish  Canned tuna  Cottage and ricotta cheeses  Mild cheese, such as American, brick, mozzarella and baby Swiss  Creamy peanut butter  Plain custard or blended fruit yogurt  Moist casseroles, such as macaroni & cheese, tuna noodle  Grilled or toasted cheese sandwich Tough meats with a lot of gristle  Fried, highly seasoned, smoked and fatty meat, fish or poultry, such as frankfurters, luncheon meats, sausage, bacon, spare ribs, beef brisket, sardines, anchovies, duck and goose  Chili and other entrees made with pepper or chili pepper  Shellfish  Strongly flavored cheeses, such as sharp cheese, extra sharp cheddar, cheese containing peppers or other seasonings  Crunchy peanut butter  Any yogurt with nuts, seeds, coconut,  strawberries or raspberries  Potatoes and Starches Peeled, mashed or boiled white or sweet potatoes  Oven-baked potatoes without skin  Well cooked white rice, enriched noodles, barley, spaghetti, macaroni and other pastas Fried potatoes, potato skins and potato chips  Hard and soft taco shells  Fried, brown or wild rice  Soups Mildly flavored meat stocks  Cream soups made from allowed foods Highly seasoned soups and tomato based soups, cream soups made with gas producing vegetables, such as broccoli, cauliflower, onion, etc.  Sweets and Snacks Use in moderation and do not eat large amounts of sweets by themselves. Syrup, honey, jelly and seedless jam  Plain hard candies and plain candies made with allowed ingredients  Molasses  Marshmallows  Other candy made from allowed ingredients  Thin pretzels Jam, marmalade and preserves  Chocolate in any form  Any candy containing nuts, coconut, seeds, peppermint, spearmint or dried or fresh fruit  Popcorn, potato chips, tortilla chips  Soft or hard thick pretzels, such as sourdough  Vegetables Well cooked soft vegetables without seeds or skins, such as asparagus tips, beets, carrots, green and wax beans, chopped spinach, tender canned baby peas, squash and pumpkin Raw vegetables, tomatoes, tomato juice, tomato sauce and V-8 juice  Gas producing vegetables, such as broccoli, Brussel sprouts, cabbage, cauliflower, onions, corn, cucumber, green peppers, rutabagas, turnips, radishes and sauerkraut  Dried beans, peas and lentils  Miscellaneous Salt and spices in moderation  Mustard and vinegar in moderation Fried or highly seasoned foods  Coconut and seeds  Pickles and olives  Chili sauces, ketchup, barbecue sauce, horseradish, black pepper, chili powder and onion and garlic seasonings  Any other strongly flavored seasoning, condiment, spice or herb not tolerated  Any food not tolerated

## 2022-02-21 NOTE — Progress Notes (Signed)
Outpatient Surgical Follow Up  02/21/2022  April Foster is an 26 y.o. female.   Chief Complaint  Patient presents with   Follow-up    HPI:  April Foster is a 26 y.o. female seen in consultation at the request of Dr. Servando Snare.  For hiatal hernia and significant reflux.    She experiences heartburn that worsen when she lies supine. She continues to have reflux despite PPI.  She did have an ultrasound of the right upper quadrant revealing no evidence of gallstones and no evidence of biliary pathology.  Her abdominal pain has subsided but she still have significant reflux There is no evidence of dysphagia. she did have an EGD about a year or so ago at outside facility [facility])  revealing evidence of a hiatal hernia. Her CBC, lipase and CMP were completely  He completed CT as well as a barium swallow.  There is evidence of reflux and hiatal hernia no strictures.  Also discussed with the patient the possibility of repeating an EGD.  Patient wishes to go ahead and move forward with repair.  I honestly do not think that an EGD will change anything    No past surgical history on file.  No family history on file.  Social History:  reports that she has never smoked. She has never used smokeless tobacco. No history on file for alcohol use and drug use.  Allergies:  Allergies  Allergen Reactions   Cholecalciferol Rash   Bupropion Hives   Ibuprofen Nausea And Vomiting    Medications reviewed.    ROS Full ROS performed and is otherwise negative other than what is stated in HPI   BP 115/80   Pulse 83   Temp 98.4 F (36.9 C)   Ht 5\' 2"  (1.575 m)   Wt 188 lb (85.3 kg)   LMP 01/24/2022 (Exact Date)   SpO2 99%   BMI 34.39 kg/m   Physical Exam CONSTITUTIONAL: NAD. EYES: Pupils are equal, round,  Sclera are non-icteric. EARS, NOSE, MOUTH AND THROAT:  The oral mucosa is pink and moist. Hearing is intact to voice. LYMPH NODES:  Lymph nodes in the neck are normal. RESPIRATORY:  Lungs are  clear. There is normal respiratory effort, with equal breath sounds bilaterally, and without pathologic use of accessory muscles. CARDIOVASCULAR: Heart is regular without murmurs, gallops, or rubs. GI: The abdomen is  soft, nontender, and nondistended. There are no palpable masses. There is no hepatosplenomegaly. There are normal bowel sounds in all quadrants. GU: Rectal deferred.   MUSCULOSKELETAL: Normal muscle strength and tone. No cyanosis or edema.   SKIN: Turgor is good and there are no pathologic skin lesions or ulcers. NEUROLOGIC: Motor and sensation is grossly normal. Cranial nerves are grossly intact. PSYCH:  Oriented to person, place and time. Affect is normal.  Assessment/Plan: 26 yo female with symptomatic Hiatal hernia.  With the patient in detail about options and she wishes surgical interventions as she has significant reflux that is recalcitrant and she wishes to be off the PPI.  She has suffered reflux since her teenage years and she is done   I do think that next month we will be very reasonable to schedule her for repair of paraesophageal hernia.  She is still committed to diet and exercise and is very motivated. I discussed with her in detail about my thought process.  Proceeded discussed with her.  Risks, benefits and possible complications including but not limited to: Bleeding, infection esophageal and bowel injuries, bloating chronic  pain.  We also talked about postoperative diet modification and postoperative course.   I spent 40 minutes in this encounter including coordination of her care, placing orders, personally reviewing imaging studies and performing appropriate documentation.  Sterling Big, MD [facility] General Surgeon

## 2022-02-22 ENCOUNTER — Telehealth: Payer: Self-pay | Admitting: Surgery

## 2022-02-22 NOTE — Telephone Encounter (Signed)
Patient has been advised of Pre-Admission date/time, COVID Testing date and Surgery date.  Surgery Date: 03/29/22 Preadmission Testing Date: 03/21/22 (phone 1p-5p) Covid Testing Date: Not needed.   Patient has been made aware to call [phone], between 1-3:00pm the day before surgery, to find out what time to arrive for surgery.

## 2022-03-21 ENCOUNTER — Other Ambulatory Visit: Payer: Self-pay

## 2022-03-21 ENCOUNTER — Encounter
Admission: RE | Admit: 2022-03-21 | Discharge: 2022-03-21 | Disposition: A | Discharge status: Home / Self Care | Payer: Medicaid Other | Source: Ambulatory Visit | Attending: Surgery | Admitting: Surgery

## 2022-03-21 DIAGNOSIS — Z01812 Encounter for preprocedural laboratory examination: Secondary | ICD-10-CM

## 2022-03-21 HISTORY — DX: Gastro-esophageal reflux disease without esophagitis: K21.9

## 2022-03-21 HISTORY — DX: Personal history of other diseases of the digestive system: Z87.19

## 2022-03-21 HISTORY — DX: Depression, unspecified: F32.A

## 2022-03-21 HISTORY — DX: Vitamin D deficiency, unspecified: E55.9

## 2022-03-21 HISTORY — DX: Unspecified asthma, uncomplicated: J45.909

## 2022-03-21 HISTORY — DX: Anxiety disorder, unspecified: F41.9

## 2022-03-21 HISTORY — DX: Headache, unspecified: R51.9

## 2022-03-21 NOTE — Patient Instructions (Addendum)
Your procedure is scheduled on: 03/29/22 - Thursday Report to the Registration Desk on the 1st floor of the [facility]. To find out your arrival time, please call [phone] between 1PM - 3PM on: 03/28/22 - Wednesday If your arrival time is 6:00 am, do not arrive prior to that time as the [facility] entrance doors do not open until 6:00 am.  REMEMBER: Instructions that are not followed completely may result in serious medical risk, up to and including death; or upon the discretion of your surgeon and anesthesiologist your surgery may need to be rescheduled.  Do not eat food after midnight the night before surgery.  No gum chewing, lozengers or hard candies.  You may however, drink CLEAR liquids up to 2 hours before you are scheduled to arrive for your surgery. Do not drink anything within 2 hours of your scheduled arrival time.  Clear liquids include: - water  - apple juice without pulp - gatorade (not RED colors) - black coffee or tea (Do NOT add milk or creamers to the coffee or tea) Do NOT drink anything that is not on this list.  TAKE THESE MEDICATIONS THE MORNING OF SURGERY WITH A SIP OF WATER:  - pantoprazole (PROTONIX) 40 MG tablet, (take one the night before and one on the morning of surgery - helps to prevent nausea after surgery.) - FLUoxetine (PROZAC) 40 MG capsule - norethindrone-ethinyl estradiol-iron (LOESTRIN FE) 1.5-30 MG-MCG tablet  One week prior to surgery: Stop Anti-inflammatories (NSAIDS) such as Advil, Aleve, Ibuprofen, Motrin, Naproxen, Naprosyn and Aspirin based products such as Excedrin, Goodys Powder, BC Powder.  Stop ANY OVER THE COUNTER supplements until after surgery.  You may however, continue to take Tylenol if needed for pain up until the day of surgery.  No Alcohol for 24 hours before or after surgery.  No Smoking including e-cigarettes for 24 hours prior to surgery.  No chewable tobacco products for at least 6 hours prior to surgery.  No  nicotine patches on the day of surgery.  Do not use any "recreational" drugs for at least a week prior to your surgery.  Please be advised that the combination of cocaine and anesthesia may have negative outcomes, up to and including death. If you test positive for cocaine, your surgery will be cancelled.  On the morning of surgery brush your teeth with toothpaste and water, you may rinse your mouth with mouthwash if you wish. Do not swallow any toothpaste or mouthwash.  Do not wear jewelry, make-up, hairpins, clips or nail polish.  Do not wear lotions, powders, or perfumes.   Do not shave body from the neck down 48 hours prior to surgery just in case you cut yourself which could leave a site for infection.  Also, freshly shaved skin may become irritated if using the CHG soap.  Contact lenses, hearing aids and dentures may not be worn into surgery.  Do not bring valuables to the hospital. [facility] is not responsible for any missing/lost belongings or valuables.   Notify your doctor if there is any change in your medical condition (cold, fever, infection).  Wear comfortable clothing (specific to your surgery type) to the hospital.  After surgery, you can help prevent lung complications by doing breathing exercises.  Take deep breaths and cough every 1-2 hours. Your doctor may order a device called an Incentive Spirometer to help you take deep breaths. When coughing or sneezing, hold a pillow firmly against your incision with both hands. This is called "  splinting." Doing this helps protect your incision. It also decreases belly discomfort.  If you are being admitted to the hospital overnight, leave your suitcase in the car. After surgery it may be brought to your room.  If you are being discharged the day of surgery, you will not be allowed to drive home. You will need a responsible adult (18 years or older) to drive you home and stay with you that night.   If you are taking public  transportation, you will need to have a responsible adult (18 years or older) with you. Please confirm with your physician that it is acceptable to use public transportation.   Please call the Pre-admissions Testing Dept. at [phone] if you have any questions about these instructions.  Surgery Visitation Policy:  Patients undergoing a surgery or procedure may have two family members or support persons with them as long as the person is not COVID-19 positive or experiencing its symptoms.   Inpatient Visitation:    Visiting hours are 7 a.m. to 8 p.m. Up to four visitors are allowed at one time in a patient room, including children. The visitors may rotate out with other people during the day. One designated support person (adult) may remain overnight.

## 2022-03-26 ENCOUNTER — Ambulatory Visit (INDEPENDENT_AMBULATORY_CARE_PROVIDER_SITE_OTHER): Payer: Medicaid Other | Admitting: Surgery

## 2022-03-26 ENCOUNTER — Encounter: Payer: Self-pay | Admitting: Surgery

## 2022-03-26 VITALS — BP 118/81 | HR 91 | Temp 99.2°F | Wt 182.4 lb

## 2022-03-26 DIAGNOSIS — K449 Diaphragmatic hernia without obstruction or gangrene: Secondary | ICD-10-CM

## 2022-03-26 NOTE — Patient Instructions (Signed)
Please call with any questions or concerns. Eating Plan After Nissen Fundoplication After a Nissen fundoplication procedure, it is common to have some difficulty swallowing. The part of your body that moves food and liquid from your mouth to your stomach (esophagus) will be swollen and may feel tight. It will take several weeks or months for your esophagus and stomach to heal. By following a special eating plan, you can prevent problems such as pain, swelling or pressure in the abdomen (bloating), gas, nausea, or diarrhea. What are tips for following this plan? Cooking Cook all foods until they are soft. Remove skins and seeds from fruits and vegetables before eating. Remove skin and gristle from meats. Grind or finely mince meats before eating. Avoid over-cooking meat. Dry, tough meat is more difficult to swallow. Avoid using oil when cooking, or use only a small amount of oil. Avoid using seasoning when cooking, or use only a small amount of seasoning. Toast bread before eating. This makes it easier to swallow. Meal planning  Eat 6-8 small meals throughout the day. Right after the surgery, have a few meals that are only clear liquids. Clear liquids include: Water. Clear fruit juice, no pulp. Chicken, beef, or vegetable broth. Gelatin. Decaffeinated tea or coffee without milk. Popsicles or shaved ice. Depending on your progress, you may move to a full liquid diet as told by your health care provider. This includes clear liquids and the following: Dairy and alternative milks, such as soy milk. Strained creamed soups. Ice cream or sherbet. Pudding. Nutritional supplement drinks. Yogurt. A few days after surgery, you may be able to start eating a diet of soft foods. You may need to eat according to this plan for several weeks. Do not eat sweets or sweetened drinks at the beginning of a meal. Doing that may cause your stomach to empty faster than it should (dumping  syndrome). Lifestyle Always sit upright when eating or drinking. Eat slowly. Take small bites and chew food well before swallowing. Do not lie down after eating. Stay sitting up for 30 minutes or longer after each meal. Sip fluids between meals. Limit how much you drink at one time. With meals and snacks, have 4-8 oz (120-240 mL). This is equal to  cup-1 cup. Do not mix solid foods and liquids in the same mouthful. Drink enough fluid to keep your urine pale yellow. Do not chew gum or drink fluids through a straw. Doing those things may cause you to swallow extra air. General information Do not drink carbonated drinks or alcohol. Avoid foods and drinks that contain caffeine and chocolate. Avoid foods and drinks that contain citrus or tomato. Allow hot soups and drinks to cool before eating. Avoid foods that cause gas, such as beans, peas, broccoli, or cabbage. If dairy milk products cause diarrhea, avoid them or eat them in small amounts. Recommended foods Fruits Any soft-cooked fruits after skins and seeds are removed. Fruit juice. Vegetables Any soft-cooked vegetables after skins and seeds are removed. Vegetable juice. Grains Cooked cereals. Dry cereals softened with liquid. Cooked pasta, rice, or other grains. Toasted bread. Bland crackers, such as soda or graham crackers. Meats and other protein foods Tender cuts of meat, poultry, or fish after bones, skin, and gristle are removed. Poached, boiled, or scrambled eggs. Canned fish. Tofu. Creamy nut butters. Dairy Milk. Yogurt. Cottage cheese. Mild cheeses. Beverages Nutritional supplement drinks. Decaffeinated tea or coffee. Sports drinks. Fats and oils Butter. Margarine. Mayonnaise. Vegetable oil. Smooth salad dressing. Sweets and desserts  Plain hard candy. Marshmallows. Pudding. Ice cream. Gelatin. Sherbet. Seasoning and other foods Salt. Light seasonings. Mustard. Vinegar. The items listed above may not be a complete list of  recommended foods and beverages. Contact a dietitian for more information. Foods to avoid Fruits Oranges. Grapefruit. Lemons. Limes. Citrus juices. Dried fruit. Crunchy, raw fruits. Vegetables Tomato sauce. Tomato juice. Broccoli. Cauliflower. Cabbage. Brussels sprouts. Crunchy, raw vegetables. Grains High-fiber or bran cereal. Cereal with nuts, dried fruit, or coconut. Sweet breads, rolls, coffee cake, or donuts. Chewy or crusty breads. Popcorn. Meats and other protein foods Beans, peas, and lentils. Tough or fatty meats. Fried meats, chicken, or fish. Fried eggs. Nuts and seeds. Crunchy nut butters. Dairy Chocolate milk. Yogurt with chunks of fruit, nuts, seeds, or coconut. Strong cheeses. Beverages Carbonated soft drinks. Alcohol. Cocoa. Hot drinks. Fats and oils Bacon fat. Lard. Sweets and desserts Chocolate. Candy with nuts, coconut, or seeds. Peppermint. Cookies. Cakes. Pie crust. Seasoning and other foods Heavy seasonings. Chili sauce. Ketchup. Barbecue sauce. Rosita Fire. Horseradish. The items listed above may not be a complete list of foods and beverages to avoid. Contact a dietitian for more information. Summary Following this eating plan after a Nissen fundoplication is an important part of healing after surgery. After surgery, you will start with a clear liquid diet before you progress to full liquids and soft foods. You may need to eat soft foods for several weeks. Avoid eating foods that cause irritation, gas, nausea, diarrhea, or swelling or pressure in the abdomen (bloating), and avoid foods that are difficult to swallow. Talk with a dietitian about which dietary choices are best for you. This information is not intended to replace advice given to you by your health care provider. Make sure you discuss any questions you have with your health care provider. Document Revised: 03/20/2020 Document Reviewed: 03/20/2020 Elsevier Patient Education  2023 [employer].

## 2022-03-28 ENCOUNTER — Encounter: Payer: Self-pay | Admitting: Surgery

## 2022-03-28 NOTE — Progress Notes (Signed)
Outpatient Surgical Follow Up  03/28/2022  April Foster is an 26 y.o. female.   Chief Complaint  Patient presents with   Follow-up    Nissen fundoplication 03/29/2022    HPI: April Foster is a 26 y.o. female following up  For hiatal hernia and significant reflux.   She continues to have reflux despite PPI.  Her abdominal pain has subsided but she still have significant reflux There is no evidence of dysphagia. she did have an EGD about a year or so ago at outside facility [facility])  revealing evidence of a hiatal hernia. Her CBC, lipase and CMP were completely  SHe completed CT as well as a barium swallow.  I have re-reviewed the images.  There is evidence of reflux and hiatal hernia no strictures.  Also discussed with the patient the possibility of repeating an EGD.  Patient wishes to go ahead and move forward with repair.  I honestly do not think that an EGD will change anything He has lost a few pounds since our last visit and is doing some diet  Past Medical History:  Diagnosis Date   Anxiety    Asthma    Depression    GERD (gastroesophageal reflux disease)    Headache    History of hiatal hernia    Vitamin D deficiency     Past Surgical History:  Procedure Laterality Date   KNEE ARTHROSCOPY W/ LATERAL RELEASE Right 2020   TONSILLECTOMY     at 26 years old   WISDOM TOOTH EXTRACTION     2019    History reviewed. No pertinent family history.  Social History:  reports that she has never smoked. She has never used smokeless tobacco. She reports current alcohol use of about 1.0 standard drink of alcohol per week. She reports that she does not use drugs.  Allergies:  Allergies  Allergen Reactions   Cholecalciferol Rash   Bupropion Hives   Ibuprofen Nausea And Vomiting    Medications reviewed.    ROS Full ROS performed and is otherwise negative other than what is stated in HPI   BP 118/81   Pulse 91   Temp 99.2 F (37.3 C) (Oral)   Wt 182 lb 6.4 oz (82.7 kg)    LMP 03/11/2022 (Exact Date)   SpO2 98%   BMI 33.36 kg/m   Physical Exam CONSTITUTIONAL: NAD. EYES: Pupils are equal, round,  Sclera are non-icteric. EARS, NOSE, MOUTH AND THROAT:  The oral mucosa is pink and moist. Hearing is intact to voice. LYMPH NODES:  Lymph nodes in the neck are normal. RESPIRATORY:  Lungs are clear. There is normal respiratory effort, with equal breath sounds bilaterally, and without pathologic use of accessory muscles. CARDIOVASCULAR: Heart is regular without murmurs, gallops, or rubs. GI: The abdomen is  soft, nontender, and nondistended. There are no palpable masses. There is no hepatosplenomegaly. There are normal bowel sounds in all quadrants. GU: Rectal deferred.   MUSCULOSKELETAL: Normal muscle strength and tone. No cyanosis or edema.   SKIN: Turgor is good and there are no pathologic skin lesions or ulcers. NEUROLOGIC: Motor and sensation is grossly normal. Cranial nerves are grossly intact. PSYCH:  Oriented to person, place and time. Affect is normal.     Assessment/Plan: Hiatal hernia and significant reflux that is recalcitrant.  Discussed with the patient in detail about her disease process I do think that repair of hiatal hernia and Nissen fundoplication will help her symptoms.  Procedure discussed with her in detail.  Risks, benefits and possible medications including but not limited to: Bleeding, infection, bloating, dysphagia as well as esophageal and bowel injuries. I spent 40 minutes in this encounter including coordination of her care, placing orders, personally reviewing imaging studies and performing appropriate documentation   Lino Wickliff, MD FACS General Surgeon  

## 2022-03-28 NOTE — H&P (View-Only) (Signed)
Outpatient Surgical Follow Up  03/28/2022  April Foster is an 26 y.o. female.   Chief Complaint  Patient presents with   Follow-up    Nissen fundoplication 03/29/2022    HPI: April Foster is a 26 y.o. female following up  For hiatal hernia and significant reflux.   She continues to have reflux despite PPI.  Her abdominal pain has subsided but she still have significant reflux There is no evidence of dysphagia. she did have an EGD about a year or so ago at outside facility [facility])  revealing evidence of a hiatal hernia. Her CBC, lipase and CMP were completely  SHe completed CT as well as a barium swallow.  I have re-reviewed the images.  There is evidence of reflux and hiatal hernia no strictures.  Also discussed with the patient the possibility of repeating an EGD.  Patient wishes to go ahead and move forward with repair.  I honestly do not think that an EGD will change anything He has lost a few pounds since our last visit and is doing some diet  Past Medical History:  Diagnosis Date   Anxiety    Asthma    Depression    GERD (gastroesophageal reflux disease)    Headache    History of hiatal hernia    Vitamin D deficiency     Past Surgical History:  Procedure Laterality Date   KNEE ARTHROSCOPY W/ LATERAL RELEASE Right 2020   TONSILLECTOMY     at 26 years old   WISDOM TOOTH EXTRACTION     2019    History reviewed. No pertinent family history.  Social History:  reports that she has never smoked. She has never used smokeless tobacco. She reports current alcohol use of about 1.0 standard drink of alcohol per week. She reports that she does not use drugs.  Allergies:  Allergies  Allergen Reactions   Cholecalciferol Rash   Bupropion Hives   Ibuprofen Nausea And Vomiting    Medications reviewed.    ROS Full ROS performed and is otherwise negative other than what is stated in HPI   BP 118/81   Pulse 91   Temp 99.2 F (37.3 C) (Oral)   Wt 182 lb 6.4 oz (82.7 kg)    LMP 03/11/2022 (Exact Date)   SpO2 98%   BMI 33.36 kg/m   Physical Exam CONSTITUTIONAL: NAD. EYES: Pupils are equal, round,  Sclera are non-icteric. EARS, NOSE, MOUTH AND THROAT:  The oral mucosa is pink and moist. Hearing is intact to voice. LYMPH NODES:  Lymph nodes in the neck are normal. RESPIRATORY:  Lungs are clear. There is normal respiratory effort, with equal breath sounds bilaterally, and without pathologic use of accessory muscles. CARDIOVASCULAR: Heart is regular without murmurs, gallops, or rubs. GI: The abdomen is  soft, nontender, and nondistended. There are no palpable masses. There is no hepatosplenomegaly. There are normal bowel sounds in all quadrants. GU: Rectal deferred.   MUSCULOSKELETAL: Normal muscle strength and tone. No cyanosis or edema.   SKIN: Turgor is good and there are no pathologic skin lesions or ulcers. NEUROLOGIC: Motor and sensation is grossly normal. Cranial nerves are grossly intact. PSYCH:  Oriented to person, place and time. Affect is normal.     Assessment/Plan: Hiatal hernia and significant reflux that is recalcitrant.  Discussed with the patient in detail about her disease process I do think that repair of hiatal hernia and Nissen fundoplication will help her symptoms.  Procedure discussed with her in detail.  Risks, benefits and possible medications including but not limited to: Bleeding, infection, bloating, dysphagia as well as esophageal and bowel injuries. I spent 40 minutes in this encounter including coordination of her care, placing orders, personally reviewing imaging studies and performing appropriate documentation   Sterling Big, MD [facility] General Surgeon

## 2022-03-29 ENCOUNTER — Encounter: Payer: Self-pay | Admitting: Surgery

## 2022-03-29 ENCOUNTER — Observation Stay
Admission: RE | Admit: 2022-03-29 | Discharge: 2022-03-30 | Disposition: A | Discharge status: Home / Self Care | Payer: Medicaid Other | Source: Ambulatory Visit | Attending: Surgery | Admitting: Surgery

## 2022-03-29 ENCOUNTER — Other Ambulatory Visit: Payer: Self-pay

## 2022-03-29 ENCOUNTER — Ambulatory Visit: Payer: Medicaid Other | Admitting: Certified Registered"

## 2022-03-29 ENCOUNTER — Encounter
Admission: RE | Disposition: A | Discharge status: Home / Self Care | Payer: Self-pay | Source: Ambulatory Visit | Attending: Surgery

## 2022-03-29 DIAGNOSIS — Z01812 Encounter for preprocedural laboratory examination: Secondary | ICD-10-CM

## 2022-03-29 DIAGNOSIS — K449 Diaphragmatic hernia without obstruction or gangrene: Principal | ICD-10-CM | POA: Insufficient documentation

## 2022-03-29 DIAGNOSIS — J45909 Unspecified asthma, uncomplicated: Secondary | ICD-10-CM | POA: Diagnosis not present

## 2022-03-29 DIAGNOSIS — K219 Gastro-esophageal reflux disease without esophagitis: Secondary | ICD-10-CM | POA: Diagnosis not present

## 2022-03-29 DIAGNOSIS — Z8719 Personal history of other diseases of the digestive system: Secondary | ICD-10-CM

## 2022-03-29 HISTORY — PX: INSERTION OF MESH: SHX5868

## 2022-03-29 HISTORY — PX: XI ROBOTIC ASSISTED PARAESOPHAGEAL HERNIA REPAIR: SHX6871

## 2022-03-29 LAB — CREATININE, SERUM
Creatinine, Ser: 1.18 mg/dL — ABNORMAL HIGH (ref 0.44–1.00)
GFR, Estimated: >60 mL/min (ref >60)

## 2022-03-29 LAB — CBC
HCT: 39.7 % (ref 36.0–46.0)
Hemoglobin: 13.5 g/dL (ref 12.0–15.0)
MCH: 29.1 pg (ref 26.0–34.0)
MCHC: 34 g/dL (ref 30.0–36.0)
MCV: 85.6 fL (ref 80.0–100.0)
Platelets: 335 10*3/uL (ref 150–400)
RBC: 4.64 MIL/uL (ref 3.87–5.11)
RDW: 11.5 % (ref 11.5–15.5)
WBC: 15.6 10*3/uL — ABNORMAL HIGH (ref 4.0–10.5)
nRBC: 0 % (ref 0.0–0.2)

## 2022-03-29 LAB — POCT PREGNANCY, URINE: Preg Test, Ur: NEGATIVE

## 2022-03-29 SURGERY — REPAIR, HERNIA, PARAESOPHAGEAL, ROBOT-ASSISTED
Anesthesia: General | Site: Abdomen

## 2022-03-29 MED ORDER — GABAPENTIN 300 MG PO CAPS
ORAL_CAPSULE | ORAL | Status: AC
  Administered 2022-03-29: 300 mg via ORAL
  Filled 2022-03-29: qty 1

## 2022-03-29 MED ORDER — CELECOXIB 200 MG PO CAPS: 200.0000 mg | ORAL_CAPSULE | ORAL | Status: AC

## 2022-03-29 MED ORDER — MIDAZOLAM HCL 2 MG/2ML IJ SOLN
INTRAMUSCULAR | Status: AC
  Filled 2022-03-29: qty 2

## 2022-03-29 MED ORDER — PROPOFOL 10 MG/ML IV BOLUS
INTRAVENOUS | Status: AC
  Filled 2022-03-29: qty 40

## 2022-03-29 MED ORDER — MELATONIN 5 MG PO TABS: 2.5000 mg | ORAL_TABLET | Freq: Every evening | ORAL | Status: DC | PRN

## 2022-03-29 MED ORDER — SCOPOLAMINE 1 MG/3DAYS TD PT72
MEDICATED_PATCH | TRANSDERMAL | Status: AC
  Administered 2022-03-29: 1.5 mg via TRANSDERMAL
  Filled 2022-03-29: qty 1

## 2022-03-29 MED ORDER — OXYCODONE HCL 5 MG PO TABS
5.0000 mg | ORAL_TABLET | ORAL | Status: DC | PRN
  Administered 2022-03-30: 5 mg via ORAL
  Filled 2022-03-29: qty 1

## 2022-03-29 MED ORDER — METHOCARBAMOL 500 MG PO TABS
500.0000 mg | ORAL_TABLET | Freq: Three times a day (TID) | ORAL | Status: DC | PRN
  Administered 2022-03-30: 500 mg via ORAL
  Filled 2022-03-29: qty 1

## 2022-03-29 MED ORDER — PROCHLORPERAZINE EDISYLATE 10 MG/2ML IJ SOLN: 5.0000 mg | Freq: Four times a day (QID) | INTRAMUSCULAR | Status: DC | PRN

## 2022-03-29 MED ORDER — ROCURONIUM BROMIDE 100 MG/10ML IV SOLN
INTRAVENOUS | Status: DC | PRN
  Administered 2022-03-29: 60 mg via INTRAVENOUS
  Administered 2022-03-29: 10 mg via INTRAVENOUS

## 2022-03-29 MED ORDER — ONDANSETRON HCL 4 MG/2ML IJ SOLN
INTRAMUSCULAR | Status: DC | PRN
  Administered 2022-03-29: 4 mg via INTRAVENOUS

## 2022-03-29 MED ORDER — ROCURONIUM BROMIDE 10 MG/ML (PF) SYRINGE
PREFILLED_SYRINGE | INTRAVENOUS | Status: AC
Start: 2022-03-29 — End: ?
  Filled 2022-03-29: qty 10

## 2022-03-29 MED ORDER — SODIUM CHLORIDE 0.9 % IV SOLN: INTRAVENOUS | Status: DC

## 2022-03-29 MED ORDER — VISTASEAL 10 ML SINGLE DOSE KIT
PACK | CUTANEOUS | Status: DC | PRN
  Administered 2022-03-29: 10 mL via TOPICAL

## 2022-03-29 MED ORDER — CHLORHEXIDINE GLUCONATE 0.12 % MT SOLN
OROMUCOSAL | Status: AC
  Administered 2022-03-29: 15 mL via OROMUCOSAL
  Filled 2022-03-29: qty 15

## 2022-03-29 MED ORDER — ACETAMINOPHEN 500 MG PO TABS: 1000.0000 mg | ORAL_TABLET | ORAL | Status: AC

## 2022-03-29 MED ORDER — ORAL CARE MOUTH RINSE: 15.0000 mL | Freq: Once | OROMUCOSAL | Status: AC

## 2022-03-29 MED ORDER — CELECOXIB 200 MG PO CAPS
ORAL_CAPSULE | ORAL | Status: AC
  Administered 2022-03-29: 200 mg via ORAL
  Filled 2022-03-29: qty 1

## 2022-03-29 MED ORDER — HYDROMORPHONE HCL 1 MG/ML IJ SOLN
INTRAMUSCULAR | Status: AC
  Administered 2022-03-29: 0.5 mg via INTRAVENOUS
  Filled 2022-03-29: qty 1

## 2022-03-29 MED ORDER — DEXAMETHASONE SODIUM PHOSPHATE 10 MG/ML IJ SOLN
INTRAMUSCULAR | Status: DC | PRN
  Administered 2022-03-29: 10 mg via INTRAVENOUS

## 2022-03-29 MED ORDER — BUPIVACAINE LIPOSOME 1.3 % IJ SUSP
INTRAMUSCULAR | Status: AC
  Filled 2022-03-29: qty 20

## 2022-03-29 MED ORDER — ONDANSETRON HCL 4 MG/2ML IJ SOLN: 4.0000 mg | Freq: Four times a day (QID) | INTRAMUSCULAR | Status: DC | PRN

## 2022-03-29 MED ORDER — DEXAMETHASONE SODIUM PHOSPHATE 10 MG/ML IJ SOLN
INTRAMUSCULAR | Status: AC
  Filled 2022-03-29: qty 1

## 2022-03-29 MED ORDER — FENTANYL CITRATE (PF) 100 MCG/2ML IJ SOLN
INTRAMUSCULAR | Status: AC
  Filled 2022-03-29: qty 2

## 2022-03-29 MED ORDER — ARIPIPRAZOLE 2 MG PO TABS
2.0000 mg | ORAL_TABLET | Freq: Every day | ORAL | Status: DC
  Administered 2022-03-29: 2 mg via ORAL
  Filled 2022-03-29: qty 1

## 2022-03-29 MED ORDER — GABAPENTIN 300 MG PO CAPS: 300.0000 mg | ORAL_CAPSULE | ORAL | Status: AC

## 2022-03-29 MED ORDER — MORPHINE SULFATE (PF) 2 MG/ML IV SOLN: 2.0000 mg | INTRAVENOUS | Status: DC | PRN

## 2022-03-29 MED ORDER — CEFAZOLIN SODIUM-DEXTROSE 2-4 GM/100ML-% IV SOLN
2.0000 g | INTRAVENOUS | Status: AC
  Administered 2022-03-29: 2 g via INTRAVENOUS

## 2022-03-29 MED ORDER — BUPIVACAINE-EPINEPHRINE 0.25% -1:200000 IJ SOLN
INTRAMUSCULAR | Status: DC | PRN
  Administered 2022-03-29: 50 mL

## 2022-03-29 MED ORDER — FLUOXETINE HCL 20 MG PO CAPS
40.0000 mg | ORAL_CAPSULE | Freq: Every day | ORAL | Status: DC
  Administered 2022-03-30: 40 mg via ORAL
  Filled 2022-03-29 (×2): qty 2

## 2022-03-29 MED ORDER — DEXMEDETOMIDINE HCL IN NACL 200 MCG/50ML IV SOLN
INTRAVENOUS | Status: DC | PRN
  Administered 2022-03-29: 10 ug via INTRAVENOUS
  Administered 2022-03-29: 12 ug via INTRAVENOUS
  Administered 2022-03-29: 10 ug via INTRAVENOUS
  Administered 2022-03-29: 8 ug via INTRAVENOUS

## 2022-03-29 MED ORDER — DEXMEDETOMIDINE HCL IN NACL 80 MCG/20ML IV SOLN
INTRAVENOUS | Status: AC
  Filled 2022-03-29: qty 20

## 2022-03-29 MED ORDER — HYDROMORPHONE HCL 1 MG/ML IJ SOLN
0.2500 mg | INTRAMUSCULAR | Status: DC | PRN
  Administered 2022-03-29 (×2): 0.5 mg via INTRAVENOUS

## 2022-03-29 MED ORDER — SCOPOLAMINE 1 MG/3DAYS TD PT72: 1.0000 | MEDICATED_PATCH | Freq: Once | TRANSDERMAL | Status: DC

## 2022-03-29 MED ORDER — CHLORHEXIDINE GLUCONATE CLOTH 2 % EX PADS: 6.0000 | MEDICATED_PAD | Freq: Once | CUTANEOUS | Status: DC

## 2022-03-29 MED ORDER — LIDOCAINE HCL (PF) 2 % IJ SOLN
INTRAMUSCULAR | Status: AC
  Filled 2022-03-29: qty 5

## 2022-03-29 MED ORDER — DIPHENHYDRAMINE HCL 12.5 MG/5ML PO ELIX: 12.5000 mg | ORAL_SOLUTION | Freq: Four times a day (QID) | ORAL | Status: DC | PRN

## 2022-03-29 MED ORDER — DIPHENHYDRAMINE HCL 50 MG/ML IJ SOLN: 12.5000 mg | Freq: Four times a day (QID) | INTRAMUSCULAR | Status: DC | PRN

## 2022-03-29 MED ORDER — DROPERIDOL 2.5 MG/ML IJ SOLN
0.6250 mg | Freq: Once | INTRAMUSCULAR | Status: AC | PRN
  Administered 2022-03-29: 0.625 mg via INTRAVENOUS

## 2022-03-29 MED ORDER — PHENYLEPHRINE 80 MCG/ML (10ML) SYRINGE FOR IV PUSH (FOR BLOOD PRESSURE SUPPORT)
PREFILLED_SYRINGE | INTRAVENOUS | Status: DC | PRN
  Administered 2022-03-29: 80 ug via INTRAVENOUS

## 2022-03-29 MED ORDER — CEFAZOLIN SODIUM-DEXTROSE 2-4 GM/100ML-% IV SOLN
2.0000 g | Freq: Three times a day (TID) | INTRAVENOUS | Status: AC
  Administered 2022-03-29 – 2022-03-30 (×3): 2 g via INTRAVENOUS
  Filled 2022-03-29 (×3): qty 100

## 2022-03-29 MED ORDER — CEFAZOLIN SODIUM-DEXTROSE 2-4 GM/100ML-% IV SOLN
INTRAVENOUS | Status: AC
  Filled 2022-03-29: qty 100

## 2022-03-29 MED ORDER — MIDAZOLAM HCL 2 MG/2ML IJ SOLN
INTRAMUSCULAR | Status: DC | PRN
  Administered 2022-03-29: 2 mg via INTRAVENOUS

## 2022-03-29 MED ORDER — DROPERIDOL 2.5 MG/ML IJ SOLN
INTRAMUSCULAR | Status: AC
  Filled 2022-03-29: qty 2

## 2022-03-29 MED ORDER — METHOCARBAMOL 1000 MG/10ML IJ SOLN: 500.0000 mg | Freq: Three times a day (TID) | INTRAVENOUS | Status: DC | PRN

## 2022-03-29 MED ORDER — LACTATED RINGERS IV SOLN: INTRAVENOUS | Status: DC

## 2022-03-29 MED ORDER — ONDANSETRON 4 MG PO TBDP: 4.0000 mg | ORAL_TABLET | Freq: Four times a day (QID) | ORAL | Status: DC | PRN

## 2022-03-29 MED ORDER — KETOROLAC TROMETHAMINE 15 MG/ML IJ SOLN: 15.0000 mg | Freq: Four times a day (QID) | INTRAMUSCULAR | Status: AC

## 2022-03-29 MED ORDER — PROPOFOL 500 MG/50ML IV EMUL
INTRAVENOUS | Status: DC | PRN
  Administered 2022-03-29: 50 ug/kg/min via INTRAVENOUS

## 2022-03-29 MED ORDER — SUGAMMADEX SODIUM 200 MG/2ML IV SOLN
INTRAVENOUS | Status: DC | PRN
  Administered 2022-03-29: 200 mg via INTRAVENOUS

## 2022-03-29 MED ORDER — VISTASEAL 10 ML SINGLE DOSE KIT
PACK | CUTANEOUS | Status: AC
  Filled 2022-03-29: qty 10

## 2022-03-29 MED ORDER — PROPOFOL 10 MG/ML IV BOLUS
INTRAVENOUS | Status: DC | PRN
  Administered 2022-03-29: 30 mg via INTRAVENOUS
  Administered 2022-03-29: 40 mg via INTRAVENOUS
  Administered 2022-03-29: 200 mg via INTRAVENOUS

## 2022-03-29 MED ORDER — KETOROLAC TROMETHAMINE 15 MG/ML IJ SOLN
INTRAMUSCULAR | Status: AC
  Administered 2022-03-29: 15 mg via INTRAVENOUS
  Filled 2022-03-29: qty 1

## 2022-03-29 MED ORDER — ACETAMINOPHEN 500 MG PO TABS
ORAL_TABLET | ORAL | Status: AC
  Administered 2022-03-29: 1000 mg via ORAL
  Filled 2022-03-29: qty 2

## 2022-03-29 MED ORDER — FENTANYL CITRATE (PF) 100 MCG/2ML IJ SOLN
INTRAMUSCULAR | Status: DC | PRN
  Administered 2022-03-29 (×4): 50 ug via INTRAVENOUS

## 2022-03-29 MED ORDER — 0.9 % SODIUM CHLORIDE (POUR BTL) OPTIME
TOPICAL | Status: DC | PRN
  Administered 2022-03-29: 150 mL

## 2022-03-29 MED ORDER — KETOROLAC TROMETHAMINE 15 MG/ML IJ SOLN
15.0000 mg | Freq: Four times a day (QID) | INTRAMUSCULAR | Status: DC | PRN
  Administered 2022-03-30: 15 mg via INTRAVENOUS
  Filled 2022-03-29: qty 1

## 2022-03-29 MED ORDER — CHLORHEXIDINE GLUCONATE 0.12 % MT SOLN: 15.0000 mL | Freq: Once | OROMUCOSAL | Status: AC

## 2022-03-29 MED ORDER — BUPIVACAINE-EPINEPHRINE (PF) 0.25% -1:200000 IJ SOLN
INTRAMUSCULAR | Status: AC
  Filled 2022-03-29: qty 30

## 2022-03-29 MED ORDER — CHLORHEXIDINE GLUCONATE CLOTH 2 % EX PADS
6.0000 | MEDICATED_PAD | Freq: Once | CUTANEOUS | Status: AC
  Administered 2022-03-29: 6 via TOPICAL

## 2022-03-29 MED ORDER — PROCHLORPERAZINE MALEATE 10 MG PO TABS: 10.0000 mg | ORAL_TABLET | Freq: Four times a day (QID) | ORAL | Status: DC | PRN

## 2022-03-29 MED ORDER — LIDOCAINE HCL (CARDIAC) PF 100 MG/5ML IV SOSY
PREFILLED_SYRINGE | INTRAVENOUS | Status: DC | PRN
  Administered 2022-03-29: 80 mg via INTRAVENOUS

## 2022-03-29 MED ORDER — PROMETHAZINE HCL 25 MG/ML IJ SOLN: 6.2500 mg | INTRAMUSCULAR | Status: DC | PRN

## 2022-03-29 MED ORDER — ACETAMINOPHEN 500 MG PO TABS
ORAL_TABLET | ORAL | Status: AC
  Filled 2022-03-29: qty 1

## 2022-03-29 MED ORDER — ACETAMINOPHEN 500 MG PO TABS
1000.0000 mg | ORAL_TABLET | Freq: Four times a day (QID) | ORAL | Status: DC
  Administered 2022-03-29 – 2022-03-30 (×4): 1000 mg via ORAL
  Filled 2022-03-29 (×4): qty 2

## 2022-03-29 MED ORDER — ENOXAPARIN SODIUM 40 MG/0.4ML IJ SOSY
40.0000 mg | PREFILLED_SYRINGE | INTRAMUSCULAR | Status: DC
Start: 2022-03-30 — End: 2022-03-30
  Administered 2022-03-30: 40 mg via SUBCUTANEOUS
  Filled 2022-03-29: qty 0.4

## 2022-03-29 SURGICAL SUPPLY — 50 items
APPLICATOR VISTASEAL 35 (MISCELLANEOUS) ×1 IMPLANT
BLADE SURG 15 STRL LF DISP TIS (BLADE) ×1 IMPLANT
BLADE SURG 15 STRL SS (BLADE) ×1
CANNULA REDUC XI 12-8 STAPL (CANNULA) ×1
CANNULA REDUCER 12-8 DVNC XI (CANNULA) ×1 IMPLANT
DERMABOND ADVANCED (GAUZE/BANDAGES/DRESSINGS) ×1
DERMABOND ADVANCED .7 DNX12 (GAUZE/BANDAGES/DRESSINGS) ×1 IMPLANT
DRAPE 3/4 80X56 (DRAPES) ×2 IMPLANT
DRAPE ARM DVNC X/XI (DISPOSABLE) ×4 IMPLANT
DRAPE COLUMN DVNC XI (DISPOSABLE) ×1 IMPLANT
DRAPE DA VINCI XI ARM (DISPOSABLE) ×4
DRAPE DA VINCI XI COLUMN (DISPOSABLE) ×1
ELECT CAUTERY BLADE 6.4 (BLADE) ×2 IMPLANT
ELECT REM PT RETURN 9FT ADLT (ELECTROSURGICAL) ×2
ELECTRODE REM PT RTRN 9FT ADLT (ELECTROSURGICAL) ×1 IMPLANT
GLOVE BIO SURGEON STRL SZ7 (GLOVE) ×10 IMPLANT
GOWN STRL REUS W/ TWL LRG LVL3 (GOWN DISPOSABLE) ×4 IMPLANT
GOWN STRL REUS W/TWL LRG LVL3 (GOWN DISPOSABLE) ×6
GRASPER LAPSCPC 5X45 DSP (INSTRUMENTS) ×2 IMPLANT
KIT PINK PAD W/HEAD ARE REST (MISCELLANEOUS) ×2
KIT PINK PAD W/HEAD ARM REST (MISCELLANEOUS) ×1 IMPLANT
KIT TURNOVER CYSTO (KITS) ×2 IMPLANT
LABEL OR SOLS (LABEL) ×2 IMPLANT
MANIFOLD NEPTUNE II (INSTRUMENTS) ×2 IMPLANT
MESH BIO-A 7X10 SYN MAT (Mesh General) ×1 IMPLANT
NEEDLE HYPO 22GX1.5 SAFETY (NEEDLE) ×2 IMPLANT
OBTURATOR OPTICAL STANDARD 8MM (TROCAR) ×1
OBTURATOR OPTICAL STND 8 DVNC (TROCAR) ×1
OBTURATOR OPTICALSTD 8 DVNC (TROCAR) ×1 IMPLANT
PACK LAP CHOLECYSTECTOMY (MISCELLANEOUS) ×2 IMPLANT
SEAL CANN UNIV 5-8 DVNC XI (MISCELLANEOUS) ×3 IMPLANT
SEAL XI 5MM-8MM UNIVERSAL (MISCELLANEOUS) ×3
SEALER VESSEL DA VINCI XI (MISCELLANEOUS) ×1
SEALER VESSEL EXT DVNC XI (MISCELLANEOUS) ×1 IMPLANT
SOLUTION ELECTROLUBE (MISCELLANEOUS) ×2 IMPLANT
SPIKE FLUID TRANSFER (MISCELLANEOUS) ×2 IMPLANT
SPONGE T-LAP 18X18 ~~LOC~~+RFID (SPONGE) ×2 IMPLANT
STAPLER CANNULA SEAL DVNC XI (STAPLE) ×1 IMPLANT
STAPLER CANNULA SEAL XI (STAPLE) ×1
SUT MNCRL 4-0 (SUTURE) ×2
SUT MNCRL 4-0 27XMFL (SUTURE) ×2
SUT SILK 2 0 SH (SUTURE) ×6 IMPLANT
SUT VICRYL 0 AB UR-6 (SUTURE) ×4 IMPLANT
SUT VLOC 90 S/L VL9 GS22 (SUTURE) ×2 IMPLANT
SUTURE MNCRL 4-0 27XMF (SUTURE) ×1 IMPLANT
SYR 20ML LL LF (SYRINGE) ×2 IMPLANT
SYR 30ML LL (SYRINGE) ×2 IMPLANT
TRAY FOLEY SLVR 16FR LF STAT (SET/KITS/TRAYS/PACK) ×2 IMPLANT
TROCAR XCEL NON-BLD 5MMX100MML (ENDOMECHANICALS) ×2 IMPLANT
TUBING EVAC SMOKE HEATED PNEUM (TUBING) ×2 IMPLANT

## 2022-03-29 NOTE — Discharge Instructions (Signed)
In addition to included general post-operative instructions,  Diet: Recommend following Nissen diet for at least 4 weeks. You were given a handout regarding this.   Activity: No heavy lifting >20 pounds (children, pets, laundry, garbage) for 4 weeks, but light activity and walking are encouraged. Do not drive or drink alcohol if taking narcotic pain medications or having pain that might distract from driving.  Wound care: 2 days after surgery (07/15), you may shower/get incision wet with soapy water and pat dry (do not rub incisions), but no baths or submerging incision underwater until follow-up.   Medications: Resume all home medications. For mild to moderate pain: acetaminophen (Tylenol) or ibuprofen/naproxen (if no kidney disease). Combining Tylenol with alcohol can substantially increase your risk of causing liver disease. Narcotic pain medications, if prescribed, can be used for severe pain, though may cause nausea, constipation, and drowsiness. Do not combine Tylenol and Percocet (or similar) within a 6 hour period as Percocet (and similar) contain(s) Tylenol. If you do not need the narcotic pain medication, you do not need to fill the prescription.  Call office [phone] / [phone]) at any time if any questions, worsening pain, fevers/chills, bleeding, drainage from incision site, or other concerns.

## 2022-03-29 NOTE — Anesthesia Preprocedure Evaluation (Addendum)
Anesthesia Evaluation  Patient identified by MRN, date of birth, ID band Patient awake    Reviewed: Allergy & Precautions, NPO status , Patient's Chart, lab work & pertinent test results  Airway Mallampati: II  TM Distance: >3 FB Neck ROM: full    Dental no notable dental hx.    Pulmonary asthma ,    Pulmonary exam normal        Cardiovascular negative cardio ROS Normal cardiovascular exam     Neuro/Psych PSYCHIATRIC DISORDERS Anxiety Depression negative neurological ROS     GI/Hepatic Neg liver ROS, hiatal hernia, GERD  Controlled,  Endo/Other  negative endocrine ROS  Renal/GU      Musculoskeletal   Abdominal Normal abdominal exam  (+)   Peds  Hematology negative hematology ROS (+)   Anesthesia Other Findings paraesophageal hernia  Past Medical History: No date: Anxiety No date: Asthma No date: Depression No date: GERD (gastroesophageal reflux disease) No date: Headache No date: History of hiatal hernia No date: Vitamin D deficiency  Past Surgical History: 2020: KNEE ARTHROSCOPY W/ LATERAL RELEASE; Right No date: TONSILLECTOMY     Comment:  at 26 years old No date: WISDOM TOOTH EXTRACTION     Comment:  2019     Reproductive/Obstetrics negative OB ROS                            Anesthesia Physical Anesthesia Plan  ASA: 2  Anesthesia Plan: General ETT   Post-op Pain Management: Gabapentin PO (pre-op)*, Tylenol PO (pre-op)* and Celebrex PO (pre-op)*   Induction: Intravenous  PONV Risk Score and Plan: Ondansetron, Dexamethasone, Midazolam and Scopolamine patch - Pre-op  Airway Management Planned: Oral ETT  Additional Equipment:   Intra-op Plan:   Post-operative Plan: Extubation in OR  Informed Consent: I have reviewed the patients History and Physical, chart, labs and discussed the procedure including the risks, benefits and alternatives for the proposed anesthesia  with the patient or authorized representative who has indicated his/her understanding and acceptance.     Dental Advisory Given  Plan Discussed with: Anesthesiologist, CRNA and Surgeon  Anesthesia Plan Comments:        Anesthesia Quick Evaluation

## 2022-03-29 NOTE — Interval H&P Note (Signed)
History and Physical Interval Note:  03/29/2022 7:58 AM  April Foster  has presented today for surgery, with the diagnosis of paraesophageal hernia.  The various methods of treatment have been discussed with the patient and family. After consideration of risks, benefits and other options for treatment, the patient has consented to  Procedure(s): XI ROBOTIC ASSISTED PARAESOPHAGEAL HERNIA REPAIR with RNFA to assist (N/A) as a surgical intervention.  The patient's history has been reviewed, patient examined, no change in status, stable for surgery.  I have reviewed the patient's chart and labs.  Questions were answered to the patient's satisfaction.     Mathhew Buysse F [place] Rho

## 2022-03-29 NOTE — Op Note (Addendum)
Robotic assisted laparoscopic Nissen fundoplication w repair of  paraesophageal hernia w Bio-A mesh 10x 7 cms  Pre-operative Diagnosis: GERD, hiatal hernia  Post-operative Diagnosis: same  Procedure:  Robotic assisted laparoscopic Nissen fundoplication w repair of   paraesophageal hernia w Bio-A mesh 10x 7 cms  Surgeon: Sterling Big, MD FACS  Assistant: Harvie Junior RNFA. Required due to the complexity of the case the need for exposure and lack of first assist.  Anesthesia: Gen. with endotracheal tube  Findings: Type I paraesophageal hernia hernia Moderate Loose wrap 360 degree over 50 FR Bougie   Estimated Blood Loss: 15cc             Complications: none   Procedure Details  The patient was seen again in the Holding Room. The benefits, complications, treatment options, and expected outcomes were discussed with the patient. The risks of bleeding, infection, recurrence of symptoms, failure to resolve symptoms,  esophageal damage, Dysphagia, bowel injury, any of which could require further surgery were reviewed with the patient. The likelihood of improving the patient's symptoms with return to their baseline status is good.  The patient and/or family concurred with the proposed plan, giving informed consent.  The patient was taken to Operating Room, identified  and the procedure verified.  A Time Out was held and the above information confirmed.  Prior to the induction of general anesthesia, antibiotic prophylaxis was administered. VTE prophylaxis was in place. General endotracheal anesthesia was then administered and tolerated well. After the induction, the abdomen was prepped with Chloraprep and draped in the sterile fashion. The patient was positioned in the supine position.  Cut down technique was used to enter the abdominal cavity and a Hasson trochar was placed after two vicryl stitches were anchored to the fascia. Pneumoperitoneum was then created with CO2 and tolerated well  without any adverse changes in the patient's vital signs.  Three 8-mm ports were placed under direct vision. All skin incisions  were infiltrated with a local anesthetic agent before making the incision and placing the trocars. An additional 5 mm regular laparoscopic port was placed to assist with retraction and exposure.   The patient was positioned  in reverse Trendelenburg, robot was brought to the surgical field and docked in the standard fashion.  We made sure all the instrumentation was kept indirect view at all times and that there were no collision between the arms. I scrubbed out and went to the console.  I used a robotic arm to retract the liver, the vessel sealer on my right hand and a forced bipolar grasper on my left hand.  There is along the extra 5 mm port allow me ample exposure and the ability to perform meticulous dissection  We Started dividing the lesser omentum via the pars flaccida.  We Were able to dissect the lesser curvature of the stomach and  dissected the fundus free from the right and left crus.  We circumferentially dissected the GE junction.  The hernia sac was also completely reduced and we were able to bring the stomach into the intra-abdominal position.  Attention then was turned to the greater curvature where the short gastrics were divided with sealer device.  We were able to identify the left crus and again were able to make sure there was a good circumferential dissection and that the hernia sac was completely excised.  We did perform a good dissection within the mediastinum to allow a complete reduction of the sac and a to completely allow an intra-abdominal  Nissen fundoplication.  Two strips of Bio-A were used as pledget and  the crus were approximated  with a  2-0 v lock running suture. Bio-A mesh was used to reinforced the repair and was secured with Vistaseal.  We Asked anesthesia to place a 50 French bougie and this went easily.  We also observe trajectory of  the bougie. 360 degree Nissen fundoplication was created with multiple 2-0 silk sutures and we placed 3 stitches taking some of the esophagus within that bite.  The fundoplication measured approximately 3-1/2 cm and he was floppy. I was very happy with the way the fundoplication laid and the repair of the hernia.  Inspection of the  upper quadrant was performed. No bleeding, bile  Or esophageal injuries leaks, or bowel injuries were noted. Robotic instruments and robotic arms were undocked in the standard fashion. All the needles were removed under direct visualization.   I scrubbed back in.  Pneumoperitoneum was released.  The periumbilical port site was closed with interrumpted 0 Vicryl sutures. 4-0 subcuticular Monocryl was used to close the skin. Liposomal marcaine was injected to all the incisions sites.  Dermabond was  applied.  The patient was then extubated and brought to the recovery room in stable condition. Sponge, lap, and needle counts were correct at closure and at the conclusion of the case.               Sterling Big, MD, FACS

## 2022-03-29 NOTE — Anesthesia Procedure Notes (Signed)
Procedure Name: Intubation Date/Time: 03/29/2022 9:30 AM  Performed by: Marcelle Hepner, CRNAPre-anesthesia Checklist: Patient identified, Patient being monitored, Timeout performed, Emergency Drugs available and Suction available Patient Re-evaluated:Patient Re-evaluated prior to induction Oxygen Delivery Method: Circle system utilized Preoxygenation: Pre-oxygenation with 100% oxygen Induction Type: IV induction Ventilation: Mask ventilation without difficulty Laryngoscope Size: Miller and 2 Grade View: Grade I Tube type: Oral Tube size: 7.0 mm Number of attempts: 1 Placement Confirmation: ETT inserted through vocal cords under direct vision, positive ETCO2 and breath sounds checked- equal and bilateral Secured at: 21 cm Tube secured with: Tape Dental Injury: Teeth and Oropharynx as per pre-operative assessment

## 2022-03-29 NOTE — Transfer of Care (Signed)
Immediate Anesthesia Transfer of Care Note  Patient: April Foster  Procedure(s) Performed: XI ROBOTIC ASSISTED PARAESOPHAGEAL HERNIA REPAIR with RNFA to assist (Abdomen) INSERTION OF MESH (Abdomen)  Patient Location: PACU  Anesthesia Type:General  Level of Consciousness: awake, pateint uncooperative and confused  Airway & Oxygen Therapy: Patient Spontanous Breathing and Patient connected to face mask oxygen  Post-op Assessment: Report given to RN and Post -op Vital signs reviewed and stable  Post vital signs: stable  Last Vitals:  Vitals Value Taken Time  BP 125/80 03/29/22 1205  Temp    Pulse 93 03/29/22 1211  Resp 30 03/29/22 1211  SpO2 96 % 03/29/22 1211  Vitals shown include unvalidated device data.  Last Pain:  Vitals:   03/29/22 0812  TempSrc: Temporal  PainSc: 0-No pain         Complications: No notable events documented.

## 2022-03-29 NOTE — Plan of Care (Signed)

## 2022-03-30 ENCOUNTER — Encounter: Payer: Self-pay | Admitting: Surgery

## 2022-03-30 DIAGNOSIS — K449 Diaphragmatic hernia without obstruction or gangrene: Secondary | ICD-10-CM | POA: Diagnosis not present

## 2022-03-30 LAB — CBC
HCT: 38.1 % (ref 36.0–46.0)
Hemoglobin: 12.8 g/dL (ref 12.0–15.0)
MCH: 29.1 pg (ref 26.0–34.0)
MCHC: 33.6 g/dL (ref 30.0–36.0)
MCV: 86.6 fL (ref 80.0–100.0)
Platelets: 315 10*3/uL (ref 150–400)
RBC: 4.4 MIL/uL (ref 3.87–5.11)
RDW: 11.6 % (ref 11.5–15.5)
WBC: 12.1 10*3/uL — ABNORMAL HIGH (ref 4.0–10.5)
nRBC: 0 % (ref 0.0–0.2)

## 2022-03-30 LAB — BASIC METABOLIC PANEL
Anion gap: 6 (ref 5–15)
BUN: 8 mg/dL (ref 6–20)
CO2: 24 mmol/L (ref 22–32)
Calcium: 8.9 mg/dL (ref 8.9–10.3)
Chloride: 108 mmol/L (ref 98–111)
Creatinine, Ser: 1.03 mg/dL — ABNORMAL HIGH (ref 0.44–1.00)
GFR, Estimated: >60 mL/min (ref >60)
Glucose, Bld: 89 mg/dL (ref 70–99)
Potassium: 4 mmol/L (ref 3.5–5.1)
Sodium: 138 mmol/L (ref 135–145)

## 2022-03-30 LAB — HIV ANTIBODY (ROUTINE TESTING W REFLEX): HIV Screen 4th Generation wRfx: NONREACTIVE

## 2022-03-30 MED ORDER — HYDROCODONE-ACETAMINOPHEN 5-325 MG PO TABS: 1.0000 | ORAL_TABLET | ORAL | 0 refills | Status: DC | PRN

## 2022-03-30 NOTE — Progress Notes (Signed)
Mobility Specialist - Progress Note   03/30/22 1154  Mobility  Activity Ambulated independently in hallway  Level of Assistance Independent  Distance Ambulated (ft) 480 ft  Activity Response Tolerated well  $Mobility charge 1 Mobility    Clarisa Schools Mobility Specialist 03/30/22, 11:55 AM

## 2022-03-30 NOTE — Progress Notes (Signed)
Mobility Specialist - Progress Note    03/30/22 1100  Mobility  Activity Ambulated independently in hallway  Level of Assistance Independent  Distance Ambulated (ft) 320 ft  Activity Response Tolerated well  $Mobility charge 1 Mobility   Clarisa Schools Mobility Specialist 03/30/22, 11:16 AM

## 2022-03-30 NOTE — Anesthesia Postprocedure Evaluation (Signed)
Anesthesia Post Note  Patient: April Foster  Procedure(s) Performed: XI ROBOTIC ASSISTED PARAESOPHAGEAL HERNIA REPAIR with RNFA to assist (Abdomen) INSERTION OF MESH (Abdomen)  Patient location during evaluation: PACU Anesthesia Type: General Level of consciousness: awake and alert Pain management: pain level controlled Vital Signs Assessment: post-procedure vital signs reviewed and stable Respiratory status: spontaneous breathing, nonlabored ventilation and respiratory function stable Cardiovascular status: blood pressure returned to baseline and stable Postop Assessment: no apparent nausea or vomiting Anesthetic complications: no   No notable events documented.   Last Vitals:  Vitals:   03/29/22 1930 03/30/22 0403  BP: 124/85 127/87  Pulse: (!) 102 85  Resp: 16 15  Temp: 36.7 C 37.1 C  SpO2: 100% 100%    Last Pain:  Vitals:   03/30/22 0300  TempSrc:   PainSc: 7                  Foye Deer

## 2022-03-30 NOTE — Progress Notes (Signed)
Mobility Specialist - Progress Note    03/30/22 1000  Mobility  Activity Ambulated independently in hallway  Level of Assistance Independent  Assistive Device Other (Comment) (IV Pole)  Distance Ambulated (ft) 480 ft  Activity Response Tolerated well  $Mobility charge 1 Mobility    Clarisa Schools Mobility Specialist 03/30/22, 10:10 AM

## 2022-03-30 NOTE — TOC Initial Note (Signed)
Transition of Care [facility]) - Initial/Assessment Note    Patient Details  Name: Vernon Ariel MRN: [id number] Date of Birth: Mar 15, 1996  [facility]) CM/SW Contact:    Marlowe Sax, RN Phone Number: 03/30/2022, 9:56 AM  Clinical Narrative:                  Transition of Care (TOC) Screening Note   Patient Details  Name: Aliyyah Riese Date of Birth: [birth year]   Transition of Care [facility]) CM/SW Contact:    Marlowe Sax, RN Phone Number: 03/30/2022, 9:56 AM    Transition of Care Department [facility]) has reviewed patient and no TOC needs have been identified at this time. We will continue to monitor patient advancement through interdisciplinary progression rounds. If new patient transition needs arise, please place a TOC consult.          Patient Goals and CMS Choice        Expected Discharge Plan and Services                                                Prior Living Arrangements/Services                       Activities of Daily Living Home Assistive Devices/Equipment: None ADL Screening (condition at time of admission) Patient's cognitive ability adequate to safely complete daily activities?: Yes Is the patient deaf or have difficulty hearing?: No Does the patient have difficulty seeing, even when wearing glasses/contacts?: No Does the patient have difficulty concentrating, remembering, or making decisions?: No Patient able to express need for assistance with ADLs?: Yes Does the patient have difficulty dressing or bathing?: No Independently performs ADLs?: Yes (appropriate for developmental age) Does the patient have difficulty walking or climbing stairs?: No Weakness of Legs: None Weakness of Arms/Hands: None  Permission Sought/Granted                  Emotional Assessment              Admission diagnosis:  S/P repair of paraesophageal hernia [U82.800, Z87.19] Patient Active Problem List   Diagnosis Date  Noted   S/P repair of paraesophageal hernia 03/29/2022   PCP:  Ronal Fear, PA-C Pharmacy:   [facility] [address], [place] - [address] [place] [address] Phone: [phone] Fax: [phone]     Social Determinants of Health (SDOH) Interventions    Readmission Risk Interventions     No data to display

## 2022-03-30 NOTE — Plan of Care (Signed)

## 2022-03-30 NOTE — Discharge Summary (Signed)
  Patient ID: April Foster MRN: [id number] DOB/AGE: 1995-11-13 26 y.o.  Admit date: 03/29/2022 Discharge date: 03/30/2022   Discharge Diagnoses:  Principal Problem:   S/P repair of paraesophageal hernia   Procedures: Robotic repair of paraesophageal hernia  Hospital Course:  Patient underwent an uneventful robotic repair of paraesophageal hernia with fundoplication.  Patient was kept overnight.  At The time of discharge the patient was ambulating,  tolerating diet  and pain was controlled.  Her vital signs were stable and she was afebrile.   physical exam at discharge showed a pt  in no acute distress.  Awake and alert.  Abdomen: Soft incisions healing well without infection or peritonitis.  Extremities well-perfused and no edema.  Condition of the patient the time of discharge was stable  Disposition: Discharge disposition: 01-Home or Self Care       Discharge Instructions     Call MD for:  difficulty breathing, headache or visual disturbances   Complete by: As directed    Call MD for:  extreme fatigue   Complete by: As directed    Call MD for:  hives   Complete by: As directed    Call MD for:  persistant dizziness or light-headedness   Complete by: As directed    Call MD for:  persistant nausea and vomiting   Complete by: As directed    Call MD for:  redness, tenderness, or signs of infection (pain, swelling, redness, odor or green/yellow discharge around incision site)   Complete by: As directed    Call MD for:  severe uncontrolled pain   Complete by: As directed    Call MD for:  temperature >100.4   Complete by: As directed    Discharge instructions   Complete by: As directed    Shower starting tomorrow   Increase activity slowly   Complete by: As directed    Lifting restrictions   Complete by: As directed    20 lbs x 6 wks      Allergies as of 03/30/2022       Reactions   Cholecalciferol Rash   Bupropion Hives   Ibuprofen Nausea And Vomiting, Rash         Medication List     STOP taking these medications    acetaminophen 500 MG tablet Commonly known as: TYLENOL   pantoprazole 40 MG tablet Commonly known as: PROTONIX       TAKE these medications    ARIPiprazole 2 MG tablet Commonly known as: ABILIFY Take 2 mg by mouth at bedtime.   FLUoxetine 40 MG capsule Commonly known as: PROZAC Take 40 mg by mouth daily.   HYDROcodone-acetaminophen 5-325 MG tablet Commonly known as: NORCO/VICODIN Take 1-2 tablets by mouth every 4 (four) hours as needed for moderate pain.   norethindrone-ethinyl estradiol-iron 1.5-30 MG-MCG tablet Commonly known as: LOESTRIN FE Take by mouth.        Follow-up Information     Leafy Ro, MD. Go on 04/16/2022.   Specialty: General Surgery Why: Appointment @ 9:00 am. Contact information: [address] [place] [address] [phone]                  Sterling Big, MD FACS

## 2022-04-16 ENCOUNTER — Ambulatory Visit (INDEPENDENT_AMBULATORY_CARE_PROVIDER_SITE_OTHER): Payer: Medicaid Other | Admitting: Surgery

## 2022-04-16 ENCOUNTER — Encounter: Payer: Self-pay | Admitting: Surgery

## 2022-04-16 VITALS — BP 136/89 | HR 142 | Temp 98.6°F | Wt 174.6 lb

## 2022-04-16 DIAGNOSIS — Z09 Encounter for follow-up examination after completed treatment for conditions other than malignant neoplasm: Secondary | ICD-10-CM

## 2022-04-16 DIAGNOSIS — K449 Diaphragmatic hernia without obstruction or gangrene: Secondary | ICD-10-CM

## 2022-04-16 NOTE — Patient Instructions (Signed)
If you have any concerns or questions, please feel free to call our office.   Eating Plan After Nissen Fundoplication After a Nissen fundoplication procedure, it is common to have some difficulty swallowing. The part of your body that moves food and liquid from your mouth to your stomach (esophagus) will be swollen and may feel tight. It will take several weeks or months for your esophagus and stomach to heal. By following a special eating plan, you can prevent problems such as pain, swelling or pressure in the abdomen (bloating), gas, nausea, or diarrhea. What are tips for following this plan? Cooking Cook all foods until they are soft. Remove skins and seeds from fruits and vegetables before eating. Remove skin and gristle from meats. Grind or finely mince meats before eating. Avoid over-cooking meat. Dry, tough meat is more difficult to swallow. Avoid using oil when cooking, or use only a small amount of oil. Avoid using seasoning when cooking, or use only a small amount of seasoning. Toast bread before eating. This makes it easier to swallow. Meal planning  Eat 6-8 small meals throughout the day. Right after the surgery, have a few meals that are only clear liquids. Clear liquids include: Water. Clear fruit juice, no pulp. Chicken, beef, or vegetable broth. Gelatin. Decaffeinated tea or coffee without milk. Popsicles or shaved ice. Depending on your progress, you may move to a full liquid diet as told by your health care provider. This includes clear liquids and the following: Dairy and alternative milks, such as soy milk. Strained creamed soups. Ice cream or sherbet. Pudding. Nutritional supplement drinks. Yogurt. A few days after surgery, you may be able to start eating a diet of soft foods. You may need to eat according to this plan for several weeks. Do not eat sweets or sweetened drinks at the beginning of a meal. Doing that may cause your stomach to empty faster than it  should (dumping syndrome). Lifestyle Always sit upright when eating or drinking. Eat slowly. Take small bites and chew food well before swallowing. Do not lie down after eating. Stay sitting up for 30 minutes or longer after each meal. Sip fluids between meals. Limit how much you drink at one time. With meals and snacks, have 4-8 oz (120-240 mL). This is equal to  cup-1 cup. Do not mix solid foods and liquids in the same mouthful. Drink enough fluid to keep your urine pale yellow. Do not chew gum or drink fluids through a straw. Doing those things may cause you to swallow extra air. General information Do not drink carbonated drinks or alcohol. Avoid foods and drinks that contain caffeine and chocolate. Avoid foods and drinks that contain citrus or tomato. Allow hot soups and drinks to cool before eating. Avoid foods that cause gas, such as beans, peas, broccoli, or cabbage. If dairy milk products cause diarrhea, avoid them or eat them in small amounts. Recommended foods Fruits Any soft-cooked fruits after skins and seeds are removed. Fruit juice. Vegetables Any soft-cooked vegetables after skins and seeds are removed. Vegetable juice. Grains Cooked cereals. Dry cereals softened with liquid. Cooked pasta, rice, or other grains. Toasted bread. Bland crackers, such as soda or graham crackers. Meats and other protein foods Tender cuts of meat, poultry, or fish after bones, skin, and gristle are removed. Poached, boiled, or scrambled eggs. Canned fish. Tofu. Creamy nut butters. Dairy Milk. Yogurt. Cottage cheese. Mild cheeses. Beverages Nutritional supplement drinks. Decaffeinated tea or coffee. Sports drinks. Fats and oils Butter. Margarine.   Mayonnaise. Vegetable oil. Smooth salad dressing. Sweets and desserts Plain hard candy. Marshmallows. Pudding. Ice cream. Gelatin. Sherbet. Seasoning and other foods Salt. Light seasonings. Mustard. Vinegar. The items listed above may not be a  complete list of recommended foods and beverages. Contact a dietitian for more information. Foods to avoid Fruits Oranges. Grapefruit. Lemons. Limes. Citrus juices. Dried fruit. Crunchy, raw fruits. Vegetables Tomato sauce. Tomato juice. Broccoli. Cauliflower. Cabbage. Brussels sprouts. Crunchy, raw vegetables. Grains High-fiber or bran cereal. Cereal with nuts, dried fruit, or coconut. Sweet breads, rolls, coffee cake, or donuts. Chewy or crusty breads. Popcorn. Meats and other protein foods Beans, peas, and lentils. Tough or fatty meats. Fried meats, chicken, or fish. Fried eggs. Nuts and seeds. Crunchy nut butters. Dairy Chocolate milk. Yogurt with chunks of fruit, nuts, seeds, or coconut. Strong cheeses. Beverages Carbonated soft drinks. Alcohol. Cocoa. Hot drinks. Fats and oils Bacon fat. Lard. Sweets and desserts Chocolate. Candy with nuts, coconut, or seeds. Peppermint. Cookies. Cakes. Pie crust. Seasoning and other foods Heavy seasonings. Chili sauce. Ketchup. Barbecue sauce. Pickles. Horseradish. The items listed above may not be a complete list of foods and beverages to avoid. Contact a dietitian for more information. Summary Following this eating plan after a Nissen fundoplication is an important part of healing after surgery. After surgery, you will start with a clear liquid diet before you progress to full liquids and soft foods. You may need to eat soft foods for several weeks. Avoid eating foods that cause irritation, gas, nausea, diarrhea, or swelling or pressure in the abdomen (bloating), and avoid foods that are difficult to swallow. Talk with a dietitian about which dietary choices are best for you. This information is not intended to replace advice given to you by your health care provider. Make sure you discuss any questions you have with your health care provider. Document Revised: 03/20/2020 Document Reviewed: 03/20/2020 Elsevier Patient Education  2023 Elsevier  Inc.  

## 2022-04-20 NOTE — Progress Notes (Signed)
April Foster is following after 2 weeks and a half from hiatal hernia repair.  She is doing very well.  Denies any reflux denies any dysphagia.  No complaints at this time  Physical exam reveals a female in no acute distress awake and alert Abdomen: Soft nontender incisions healing well without infection   A/p doing very well after hiatal hernia repair with Nissen.  Follow-up in a few months.  Continue Nissen diet.

## 2022-06-11 ENCOUNTER — Encounter: Payer: Self-pay | Admitting: Surgery

## 2022-06-11 ENCOUNTER — Telehealth: Payer: Self-pay

## 2022-06-11 ENCOUNTER — Ambulatory Visit (INDEPENDENT_AMBULATORY_CARE_PROVIDER_SITE_OTHER): Payer: Medicaid Other | Admitting: Surgery

## 2022-06-11 ENCOUNTER — Other Ambulatory Visit: Payer: Self-pay

## 2022-06-11 VITALS — BP 121/78 | HR 76 | Temp 98.3°F | Ht 62.0 in | Wt 162.0 lb

## 2022-06-11 DIAGNOSIS — R1011 Right upper quadrant pain: Secondary | ICD-10-CM

## 2022-06-11 DIAGNOSIS — K449 Diaphragmatic hernia without obstruction or gangrene: Secondary | ICD-10-CM

## 2022-06-11 MED ORDER — ONDANSETRON HCL 4 MG PO TABS: 4.0000 mg | ORAL_TABLET | Freq: Three times a day (TID) | ORAL | 0 refills | Status: DC | PRN

## 2022-06-11 NOTE — Progress Notes (Signed)
April Foster is over 2 months following a robotic paraesophageal hernia repair.  She was doing very well up to about 3 days ago where she started having some abdominal pain and some regurgitation.  No fevers no chills.  No evidence of food impaction.  She overall feels great but this was a new setback. She she has lost some weight but this has been intentional and she is very happy about it  pE: NAD alert Abd: soft, nt, scars well healed.  A/P late onset of nausea.  Will prescribe  Zofran.  We will interrogate other etiologies such as biliary disease.  I will order an ultrasound of the right upper quadrant.  We will follow her up in a few weeks

## 2022-06-11 NOTE — Telephone Encounter (Signed)
Patient called and would like to see about getting worked up for gallbladder problems as she has a family history of this. Per Dr Dahlia Byes will get scheduled for an Ultrasound of the gallbladder.  The patient is scheduled for an Ultrasound of the gallbladder at [facility] on 06/13/22 at 4:00 pm. She will arrive there by 3:45 pm and will have nothing to eat or drink for 8 hours prior.

## 2022-06-11 NOTE — Patient Instructions (Addendum)
We have sent in Zofran for you to the pharmacy.   Follow up here in 3 weeks.

## 2022-06-13 ENCOUNTER — Encounter: Payer: Medicaid Other | Admitting: Surgery

## 2022-06-13 ENCOUNTER — Ambulatory Visit
Admission: RE | Admit: 2022-06-13 | Discharge: 2022-06-13 | Disposition: A | Discharge status: Home / Self Care | Payer: Medicaid Other | Source: Ambulatory Visit | Attending: Surgery | Admitting: Surgery

## 2022-06-13 DIAGNOSIS — R1011 Right upper quadrant pain: Secondary | ICD-10-CM | POA: Diagnosis present

## 2022-06-25 ENCOUNTER — Encounter: Payer: Medicaid Other | Admitting: Surgery

## 2022-07-02 ENCOUNTER — Other Ambulatory Visit: Payer: Self-pay

## 2022-07-02 ENCOUNTER — Ambulatory Visit (INDEPENDENT_AMBULATORY_CARE_PROVIDER_SITE_OTHER): Payer: Medicaid Other | Admitting: Surgery

## 2022-07-02 ENCOUNTER — Encounter: Payer: Self-pay | Admitting: Surgery

## 2022-07-02 VITALS — BP 126/85 | HR 78 | Temp 98.1°F | Ht 62.0 in | Wt 162.0 lb

## 2022-07-02 DIAGNOSIS — Z09 Encounter for follow-up examination after completed treatment for conditions other than malignant neoplasm: Secondary | ICD-10-CM | POA: Diagnosis not present

## 2022-07-02 DIAGNOSIS — Z8719 Personal history of other diseases of the digestive system: Secondary | ICD-10-CM

## 2022-07-02 DIAGNOSIS — K449 Diaphragmatic hernia without obstruction or gangrene: Secondary | ICD-10-CM | POA: Diagnosis not present

## 2022-07-02 NOTE — Patient Instructions (Signed)
Please call with any questions or concerns.

## 2022-07-04 ENCOUNTER — Encounter: Payer: Self-pay | Admitting: Surgery

## 2022-07-04 NOTE — Progress Notes (Signed)
Outpatient Surgical Follow Up  07/04/2022  April Foster is an 26 y.o. female.   Chief Complaint  Patient presents with   Follow-up    Paraesophageal hernia repair    HPI: April Foster is 3 months out after repair of paraesophageal hernia with Nissen fundoplication.  He did develop nausea that we were able to control with Zofran.  She feels much better.  She did have an ultrasound showing no evidence of gallstones.  She is eating well and she has adapted to a new.  She has lost weight and she is very happy with the results.  No evidence of reflux.  Past Medical History:  Diagnosis Date   Anxiety    Asthma    Depression    GERD (gastroesophageal reflux disease)    Headache    History of hiatal hernia    Vitamin D deficiency     Past Surgical History:  Procedure Laterality Date   INSERTION OF MESH N/A 03/29/2022   Procedure: INSERTION OF MESH;  Surgeon: Jules Husbands, MD;  Location: [facility] ORS;  Service: General;  Laterality: N/A;   KNEE ARTHROSCOPY W/ LATERAL RELEASE Right 2020   TONSILLECTOMY     at 26 years old   [place] EXTRACTION     2019   XI ROBOTIC ASSISTED PARAESOPHAGEAL HERNIA REPAIR N/A 03/29/2022   Procedure: XI ROBOTIC ASSISTED PARAESOPHAGEAL HERNIA REPAIR with RNFA to assist;  Surgeon: Jules Husbands, MD;  Location: [facility] ORS;  Service: General;  Laterality: N/A;    History reviewed. No pertinent family history.  Social History:  reports that she has never smoked. She has never used smokeless tobacco. She reports current alcohol use of about 1.0 standard drink of alcohol per week. She reports that she does not use drugs.  Allergies:  Allergies  Allergen Reactions   Cholecalciferol Rash   Bupropion Hives   Ibuprofen Nausea And Vomiting and Rash    Medications reviewed.    ROS Full ROS performed and is otherwise negative other than what is stated in HPI   BP 126/85   Pulse 78   Temp 98.1 F (36.7 C) (Oral)   Ht 5\' 2"  (1.575 m)   Wt 162 lb (73.5 kg)    SpO2 100%   BMI 29.63 kg/m   Physical Exam Vitals and nursing note reviewed. Exam conducted with a chaperone present.  Constitutional:      General: She is not in acute distress.    Appearance: Normal appearance. She is normal weight. She is not ill-appearing.  Cardiovascular:     Rate and Rhythm: Normal rate and regular rhythm.  Pulmonary:     Effort: Pulmonary effort is normal. No respiratory distress.     Breath sounds: Normal breath sounds. No stridor.  Abdominal:     General: Abdomen is flat. There is no distension.     Palpations: Abdomen is soft. There is no mass.     Tenderness: There is no abdominal tenderness. There is no guarding or rebound.     Hernia: No hernia is present.     Comments: Lesions healed without evidence of infection or hernias.  Musculoskeletal:     Cervical back: Normal range of motion and neck supple. No rigidity or tenderness.  Skin:    General: Skin is warm and dry.     Capillary Refill: Capillary refill takes less than 2 seconds.  Neurological:     General: No focal deficit present.     Mental Status: She is alert  and oriented to person, place, and time.  Psychiatric:        Mood and Affect: Mood normal.        Behavior: Behavior normal.        Thought Content: Thought content normal.        Judgment: Judgment normal.      Assessment/Plan: 23 s/ post paraesophageal hernia repair with clinical resolved.  No evidence of reflux or evidence of complications.  Evidence of gallbladder disease.  At this time no surgical needs.  We will follow her up on a as needed basis  Greater than 50% of the 20 minutes  visit was spent in counseling/coordination of care   Sterling Big, MD [facility] General Surgeon

## 2022-07-23 ENCOUNTER — Other Ambulatory Visit: Payer: Self-pay

## 2022-07-23 MED ORDER — ONDANSETRON HCL 4 MG PO TABS: 4.0000 mg | ORAL_TABLET | Freq: Three times a day (TID) | ORAL | 0 refills | Status: DC | PRN

## 2022-10-10 ENCOUNTER — Ambulatory Visit: Payer: Medicaid Other | Admitting: Surgery

## 2022-10-22 ENCOUNTER — Ambulatory Visit (INDEPENDENT_AMBULATORY_CARE_PROVIDER_SITE_OTHER): Payer: Medicaid Other | Admitting: Surgery

## 2022-10-22 ENCOUNTER — Encounter: Payer: Self-pay | Admitting: Surgery

## 2022-10-22 VITALS — BP 111/73 | HR 85 | Temp 99.2°F | Ht 62.0 in | Wt 152.8 lb

## 2022-10-22 DIAGNOSIS — K429 Umbilical hernia without obstruction or gangrene: Secondary | ICD-10-CM

## 2022-10-22 NOTE — Patient Instructions (Signed)
If you have any concerns or questions, please feel free to call our office. Follow up in 3 months.  Ventral Hernia  A ventral hernia is a bulge of tissue from inside the abdomen that pushes through a weak area of the muscles that form the front wall of the abdomen. The tissues inside the abdomen are inside a sac (peritoneum). These tissues include the small intestine, large intestine, and the fatty tissue that covers the intestines (omentum). Sometimes, the bulge that forms a hernia contains intestines. Other hernias contain only fat. Ventral hernias do not go away without surgical treatment. There are several types of ventral hernias. You may have: A hernia at an incision site from previous abdominal surgery (incisional hernia). A hernia just above the belly button (epigastric hernia), or at the belly button (umbilical hernia). These types of hernias can develop from heavy lifting or straining. A hernia that comes and goes (reducible hernia). It may be visible only when you lift or strain. This type of hernia can be pushed back into the abdomen (reduced). A hernia that traps abdominal tissue inside the hernia (incarcerated hernia). This type of hernia does not reduce. A hernia that cuts off blood flow to the tissues inside the hernia (strangulated hernia). The tissues can start to die if this happens. This is a very painful bulge that cannot be reduced. A strangulated hernia is a medical emergency. What are the causes? This condition is caused by abdominal tissue putting pressure on an area of weakness in the abdominal muscles. What increases the risk? The following factors may make you more likely to develop this condition: Being age 68 or older. Being overweight or obese. Having had previous abdominal surgery, especially if there was an infection after surgery. Having had an injury to the abdominal wall. Frequently lifting or pushing heavy objects. Having had several pregnancies. Having a  buildup of fluid inside the abdomen (ascites). Straining to have a bowel movement or to urinate. Having frequent coughing episodes. What are the signs or symptoms? The only symptom of a ventral hernia may be a painless bulge in the abdomen. A reducible hernia may be visible only when you strain, cough, or lift. Other symptoms may include: Dull pain. A feeling of pressure. Signs and symptoms of a strangulated hernia may include: Increasing pain. Nausea and vomiting. Pain when pressing on the hernia. The skin over the hernia turning red or purple. Constipation. Blood in the stool (feces). How is this diagnosed? This condition may be diagnosed based on: Your symptoms. Your medical history. A physical exam. You may be asked to cough or strain while standing. These actions increase the pressure inside your abdomen and force the hernia through the opening in your muscles. Your health care provider may try to reduce the hernia by gently pushing the hernia back in. Imaging studies, such as an ultrasound or CT scan. How is this treated? This condition is treated with surgery. If you have a strangulated hernia, surgery is done as soon as possible. If your hernia is small and not incarcerated, you may be asked to lose some weight before surgery. Follow these instructions at home: Follow instructions from your health care provider about eating or drinking restrictions. If you are overweight, your health care provider may recommend that you increase your activity level and eat a healthier diet. Do not lift anything that is heavier than 10 lb (4.5 kg), or the limit that you are told, until your health care provider says that it is  safe. Return to your normal activities as told by your health care provider. Ask your health care provider what activities are safe for you. You may need to avoid activities that increase pressure on your hernia. Take over-the-counter and prescription medicines only as told by  your health care provider. Keep all follow-up visits. This is important. Contact a health care provider if: Your hernia gets larger. Your hernia becomes painful. Get help right away if: Your hernia becomes increasingly painful. You have pain along with any of the following: Changes in skin color in the area of the hernia. Nausea. Vomiting. Fever. These symptoms may represent a serious problem that is an emergency. Do not wait to see if the symptoms will go away. Get medical help right away. Call your local emergency services (911 in the [place] Do not drive yourself to the hospital. Summary A ventral hernia is a bulge of tissue from inside the abdomen that pushes through a weak area of the muscles that form the front wall of the abdomen. This condition is treated with surgery, which may be urgent depending on your hernia. Do not lift anything that is heavier than 10 lb (4.5 kg), and follow activity instructions from your health care provider. This information is not intended to replace advice given to you by your health care provider. Make sure you discuss any questions you have with your health care provider. Document Revised: 04/22/2020 Document Reviewed: 04/22/2020 Elsevier Patient Education  [place].

## 2022-10-24 ENCOUNTER — Encounter: Payer: Self-pay | Admitting: Surgery

## 2022-10-24 NOTE — Progress Notes (Signed)
Outpatient Surgical Follow Up  10/24/2022  April Foster is an 27 y.o. female.   Chief Complaint  Patient presents with   Follow-up    Ventral hernia    HPI: He is 8 months out from paraesophageal H repair w Nissen fundoplication.  He has done very well and she is very happy with results.  No reflux.  More recently complaining of abdominal pain prompted ER visit at [facility].  At that time CT scan was performed that I personally reviewed showing evidence of a periumbilical defect.  There is no issues related to her recent surgery.  Since then she has been doing okay and has no further pain. Also seen the CT scan preoperatively and she did have a small umbilical hernia.  Past Medical History:  Diagnosis Date   Anxiety    Asthma    Depression    GERD (gastroesophageal reflux disease)    Headache    History of hiatal hernia    Vitamin D deficiency     Past Surgical History:  Procedure Laterality Date   INSERTION OF MESH N/A 03/29/2022   Procedure: INSERTION OF MESH;  Surgeon: Jules Husbands, MD;  Location: [facility] ORS;  Service: General;  Laterality: N/A;   KNEE ARTHROSCOPY W/ LATERAL RELEASE Right 2020   TONSILLECTOMY     at 27 years old   [place] EXTRACTION     2019   XI ROBOTIC ASSISTED PARAESOPHAGEAL HERNIA REPAIR N/A 03/29/2022   Procedure: XI ROBOTIC ASSISTED PARAESOPHAGEAL HERNIA REPAIR with RNFA to assist;  Surgeon: Jules Husbands, MD;  Location: [facility] ORS;  Service: General;  Laterality: N/A;    History reviewed. No pertinent family history.  Social History:  reports that she has never smoked. She has never used smokeless tobacco. She reports current alcohol use of about 1.0 standard drink of alcohol per week. She reports that she does not use drugs.  Allergies:  Allergies  Allergen Reactions   Cholecalciferol Rash   Bupropion Hives   Ibuprofen Nausea And Vomiting and Rash    Medications reviewed.    ROS Full ROS performed and is otherwise negative other than what  is stated in HPI   BP 111/73   Pulse 85   Temp 99.2 F (37.3 C) (Oral)   Ht 5\' 2"  (1.575 m)   Wt 152 lb 12.8 oz (69.3 kg)   SpO2 98%   BMI 27.95 kg/m   Physical Exam CONSTITUTIONAL: NAD. EYES: Pupils are equal, round,  Sclera are non-icteric. EARS, NOSE, MOUTH AND THROAT:  The oral mucosa is pink and moist. Hearing is intact to voice. LYMPH NODES:  Lymph nodes in the neck are normal. RESPIRATORY:  Lungs are clear. There is normal respiratory effort, with equal breath sounds bilaterally, and without pathologic use of accessory muscles. CARDIOVASCULAR: Heart is regular without murmurs, gallops, or rubs. GI: The abdomen is  soft, nontender, and nondistended. Prior robotic scars, Periumbilical reducible 3 cm umbilical hernias. There are no palpable masses. There is no hepatosplenomegaly. There are normal bowel sounds GU: Rectal deferred.   MUSCULOSKELETAL: Normal muscle strength and tone. No cyanosis or edema.   SKIN: Turgor is good and there are no pathologic skin lesions or ulcers. NEUROLOGIC: Motor and sensation is grossly normal. Cranial nerves are grossly intact. PSYCH:  Oriented to person, place and time. Affect is normal.      Assessment/Plan: Symptomatic periumbilical  hernia . I  Discussed with the patient in detail about her disease process I do think  that  we will need to repair it at some point in time.o Thing at this time she wishes to wait a couple months before definitive repair is done for the hernia.  I do think that is reasonable  Procedure discussed with her in detail.  Risks, benefits and possible medications including but not limited to: Bleeding, infection, bloating, dysphagia as well as esophageal and bowel injuries. We will see her back in 2 months I spent 30 minutes in this encounter including coordination of her care, placing orders, personally reviewing imaging studies and performing appropriate documentation     Caroleen Hamman, MD [place] Surgeon

## 2022-11-21 ENCOUNTER — Ambulatory Visit (INDEPENDENT_AMBULATORY_CARE_PROVIDER_SITE_OTHER): Payer: Medicaid Other | Admitting: Surgery

## 2022-11-21 ENCOUNTER — Encounter: Payer: Self-pay | Admitting: Surgery

## 2022-11-21 ENCOUNTER — Other Ambulatory Visit: Payer: Self-pay

## 2022-11-21 VITALS — BP 113/76 | HR 79 | Temp 98.3°F | Ht 62.0 in | Wt 147.0 lb

## 2022-11-21 DIAGNOSIS — K429 Umbilical hernia without obstruction or gangrene: Secondary | ICD-10-CM | POA: Diagnosis not present

## 2022-11-21 NOTE — Patient Instructions (Signed)
Our surgery scheduler will call you within 24-48 hours to schedule your surgery. Please have the [place] surgery sheet available when speaking with her.    Ventral Hernia  A ventral hernia is a bulge of tissue from inside the abdomen that pushes through a weak area of the muscles that form the front wall of the abdomen. The tissues inside the abdomen are inside a sac (peritoneum). These tissues include the small intestine, large intestine, and the fatty tissue that covers the intestines (omentum). Sometimes, the bulge that forms a hernia contains intestines. Other hernias contain only fat. Ventral hernias do not go away without surgical treatment. There are several types of ventral hernias. You may have: A hernia at an incision site from previous abdominal surgery (incisional hernia). A hernia just above the belly button (epigastric hernia), or at the belly button (umbilical hernia). These types of hernias can develop from heavy lifting or straining. A hernia that comes and goes (reducible hernia). It may be visible only when you lift or strain. This type of hernia can be pushed back into the abdomen (reduced). A hernia that traps abdominal tissue inside the hernia (incarcerated hernia). This type of hernia does not reduce. A hernia that cuts off blood flow to the tissues inside the hernia (strangulated hernia). The tissues can start to die if this happens. This is a very painful bulge that cannot be reduced. A strangulated hernia is a medical emergency. What are the causes? This condition is caused by abdominal tissue putting pressure on an area of weakness in the abdominal muscles. What increases the risk? The following factors may make you more likely to develop this condition: Being age 74 or older. Being overweight or obese. Having had previous abdominal surgery, especially if there was an infection after surgery. Having had an injury to the abdominal wall. Frequently lifting or pushing heavy  objects. Having had several pregnancies. Having a buildup of fluid inside the abdomen (ascites). Straining to have a bowel movement or to urinate. Having frequent coughing episodes. What are the signs or symptoms? The only symptom of a ventral hernia may be a painless bulge in the abdomen. A reducible hernia may be visible only when you strain, cough, or lift. Other symptoms may include: Dull pain. A feeling of pressure. Signs and symptoms of a strangulated hernia may include: Increasing pain. Nausea and vomiting. Pain when pressing on the hernia. The skin over the hernia turning red or purple. Constipation. Blood in the stool (feces). How is this diagnosed? This condition may be diagnosed based on: Your symptoms. Your medical history. A physical exam. You may be asked to cough or strain while standing. These actions increase the pressure inside your abdomen and force the hernia through the opening in your muscles. Your health care provider may try to reduce the hernia by gently pushing the hernia back in. Imaging studies, such as an ultrasound or CT scan. How is this treated? This condition is treated with surgery. If you have a strangulated hernia, surgery is done as soon as possible. If your hernia is small and not incarcerated, you may be asked to lose some weight before surgery. Follow these instructions at home: Follow instructions from your health care provider about eating or drinking restrictions. If you are overweight, your health care provider may recommend that you increase your activity level and eat a healthier diet. Do not lift anything that is heavier than 10 lb (4.5 kg), or the limit that you are told, until your  health care provider says that it is safe. Return to your normal activities as told by your health care provider. Ask your health care provider what activities are safe for you. You may need to avoid activities that increase pressure on your hernia. Take  over-the-counter and prescription medicines only as told by your health care provider. Keep all follow-up visits. This is important. Contact a health care provider if: Your hernia gets larger. Your hernia becomes painful. Get help right away if: Your hernia becomes increasingly painful. You have pain along with any of the following: Changes in skin color in the area of the hernia. Nausea. Vomiting. Fever. These symptoms may represent a serious problem that is an emergency. Do not wait to see if the symptoms will go away. Get medical help right away. Call your local emergency services (911 in the [place] Do not drive yourself to the hospital. Summary A ventral hernia is a bulge of tissue from inside the abdomen that pushes through a weak area of the muscles that form the front wall of the abdomen. This condition is treated with surgery, which may be urgent depending on your hernia. Do not lift anything that is heavier than 10 lb (4.5 kg), and follow activity instructions from your health care provider. This information is not intended to replace advice given to you by your health care provider. Make sure you discuss any questions you have with your health care provider. Document Revised: 04/22/2020 Document Reviewed: 04/22/2020 Elsevier Patient Education  [place].

## 2022-11-22 ENCOUNTER — Telehealth: Payer: Self-pay | Admitting: Surgery

## 2022-11-22 NOTE — Telephone Encounter (Signed)
Patient has been advised of Pre-Admission date/time, and Surgery date at [facility].  Surgery Date: 12/11/22 Preadmission Testing Date: 12/03/22 (phone 8a-1p)  Patient has been made aware to call [phone], between 1-3:00pm the day before surgery, to find out what time to arrive for surgery.

## 2022-11-22 NOTE — Progress Notes (Signed)
Outpatient Surgical Follow Up  11/22/2022  April Foster is an 27 y.o. female.   Chief Complaint  Patient presents with   Follow-up    Umbilical hernia     HPI: April Foster is 9 months out from paraesophageal H repair w Nissen fundoplication.  SHe has done very well and she is very happy with results.  No reflux.  More recently complaining of abdominal pain prompted ER visit at [facility].  At that time CT scan was performed that I personally reviewed showing evidence of a periumbilical defect.  There is no issues related to her recent surgery.  Since then she has been doing okay and has no further pain. Also seen the CT scan preoperatively and she did have a small umbilical hernia. I do think this current hernia is from before and not related to my infraumbilical incision. SHe reports worsening pain over the past few weeks. No evidence of incarceration,. She is ready to have this fix.    Past Medical History:  Diagnosis Date   Anxiety    Asthma    Depression    GERD (gastroesophageal reflux disease)    Headache    History of hiatal hernia    Vitamin D deficiency     Past Surgical History:  Procedure Laterality Date   INSERTION OF MESH N/A 03/29/2022   Procedure: INSERTION OF MESH;  Surgeon: Jules Husbands, MD;  Location: [facility] ORS;  Service: General;  Laterality: N/A;   KNEE ARTHROSCOPY W/ LATERAL RELEASE Right 2020   TONSILLECTOMY     at 27 years old   [place] EXTRACTION     2019   XI ROBOTIC ASSISTED PARAESOPHAGEAL HERNIA REPAIR N/A 03/29/2022   Procedure: XI ROBOTIC ASSISTED PARAESOPHAGEAL HERNIA REPAIR with RNFA to assist;  Surgeon: Jules Husbands, MD;  Location: [facility] ORS;  Service: General;  Laterality: N/A;    History reviewed. No pertinent family history.  Social History:  reports that she has never smoked. She has never used smokeless tobacco. She reports current alcohol use of about 1.0 standard drink of alcohol per week. She reports that she does not use drugs.  Allergies:   Allergies  Allergen Reactions   Anti-Oxidant Hives   Cholecalciferol Rash   Bupropion Hives   Ibuprofen Nausea And Vomiting and Rash    Medications reviewed.    ROS Full ROS performed and is otherwise negative other than what is stated in HPI   BP 113/76   Pulse 79   Temp 98.3 F (36.8 C) (Oral)   Ht '5\' 2"'$  (1.575 m)   Wt 147 lb (66.7 kg)   SpO2 99%   BMI 26.89 kg/m   Physical Exam  CONSTITUTIONAL: NAD. EYES: Pupils are equal, round,  Sclera are non-icteric. EARS, NOSE, MOUTH AND THROAT:  The oral mucosa is pink and moist. Hearing is intact to voice. LYMPH NODES:  Lymph nodes in the neck are normal. RESPIRATORY:  Lungs are clear. There is normal respiratory effort, with equal breath sounds bilaterally, and without pathologic use of accessory muscles. CARDIOVASCULAR: Heart is regular without murmurs, gallops, or rubs. GI: The abdomen is  soft, nontender, and nondistended. Prior robotic scars, Supraumbilical reducible 3 cm umbilical hernias. There are no palpable masses. There is no hepatosplenomegaly. There are normal bowel sounds GU: Rectal deferred.   MUSCULOSKELETAL: Normal muscle strength and tone. No cyanosis or edema.   SKIN: Turgor is good and there are no pathologic skin lesions or ulcers. NEUROLOGIC: Motor and sensation is grossly normal.  Cranial nerves are grossly intact. PSYCH:  Oriented to person, place and time. Affect is normal.  Assessment/Plan: Symptomatic periumbilical  hernia . I  Discussed with the patient in detail about her disease process I do think that  we will need to repair it  and given crescendo sxs she is ready.   Procedure discussed with her in detail.  Risks, benefits and possible medications including but not limited to: Bleeding, infection, bloating, recurrence, pain, bowel injuries. We will schedule in 2-3 weeks at her convenience. I spent 40 minutes in this encounter including coordination of her care, placing orders, personally  reviewing imaging studies and performing appropriate documentation   Caroleen Hamman, MD [facility] Surgeon

## 2022-11-22 NOTE — H&P (View-Only) (Signed)
Outpatient Surgical Follow Up  11/22/2022  April Foster is an 27 y.o. female.   Chief Complaint  Patient presents with   Follow-up    Umbilical hernia     HPI: April Foster is 9 months out from paraesophageal H repair w Nissen fundoplication.  SHe has done very well and she is very happy with results.  No reflux.  More recently complaining of abdominal pain prompted ER visit at [facility].  At that time CT scan was performed that I personally reviewed showing evidence of a periumbilical defect.  There is no issues related to her recent surgery.  Since then she has been doing okay and has no further pain. Also seen the CT scan preoperatively and she did have a small umbilical hernia. I do think this current hernia is from before and not related to my infraumbilical incision. SHe reports worsening pain over the past few weeks. No evidence of incarceration,. She is ready to have this fix.    Past Medical History:  Diagnosis Date   Anxiety    Asthma    Depression    GERD (gastroesophageal reflux disease)    Headache    History of hiatal hernia    Vitamin D deficiency     Past Surgical History:  Procedure Laterality Date   INSERTION OF MESH N/A 03/29/2022   Procedure: INSERTION OF MESH;  Surgeon: Oryn Casanova F, MD;  Location: [facility] ORS;  Service: General;  Laterality: N/A;   KNEE ARTHROSCOPY W/ LATERAL RELEASE Right 2020   TONSILLECTOMY     at 27 years old   WISDOM TOOTH EXTRACTION     2019   XI ROBOTIC ASSISTED PARAESOPHAGEAL HERNIA REPAIR N/A 03/29/2022   Procedure: XI ROBOTIC ASSISTED PARAESOPHAGEAL HERNIA REPAIR with RNFA to assist;  Surgeon: Chauntelle Azpeitia F, MD;  Location: [facility] ORS;  Service: General;  Laterality: N/A;    History reviewed. No pertinent family history.  Social History:  reports that she has never smoked. She has never used smokeless tobacco. She reports current alcohol use of about 1.0 standard drink of alcohol per week. She reports that she does not use drugs.  Allergies:   Allergies  Allergen Reactions   Anti-Oxidant Hives   Cholecalciferol Rash   Bupropion Hives   Ibuprofen Nausea And Vomiting and Rash    Medications reviewed.    ROS Full ROS performed and is otherwise negative other than what is stated in HPI   BP 113/76   Pulse 79   Temp 98.3 F (36.8 C) (Oral)   Ht 5' 2" (1.575 m)   Wt 147 lb (66.7 kg)   SpO2 99%   BMI 26.89 kg/m   Physical Exam  CONSTITUTIONAL: NAD. EYES: Pupils are equal, round,  Sclera are non-icteric. EARS, NOSE, MOUTH AND THROAT:  The oral mucosa is pink and moist. Hearing is intact to voice. LYMPH NODES:  Lymph nodes in the neck are normal. RESPIRATORY:  Lungs are clear. There is normal respiratory effort, with equal breath sounds bilaterally, and without pathologic use of accessory muscles. CARDIOVASCULAR: Heart is regular without murmurs, gallops, or rubs. GI: The abdomen is  soft, nontender, and nondistended. Prior robotic scars, Supraumbilical reducible 3 cm umbilical hernias. There are no palpable masses. There is no hepatosplenomegaly. There are normal bowel sounds GU: Rectal deferred.   MUSCULOSKELETAL: Normal muscle strength and tone. No cyanosis or edema.   SKIN: Turgor is good and there are no pathologic skin lesions or ulcers. NEUROLOGIC: Motor and sensation is grossly normal.   Cranial nerves are grossly intact. PSYCH:  Oriented to person, place and time. Affect is normal.  Assessment/Plan: Symptomatic periumbilical  hernia . I  Discussed with the patient in detail about her disease process I do think that  we will need to repair it  and given crescendo sxs she is ready.   Procedure discussed with her in detail.  Risks, benefits and possible medications including but not limited to: Bleeding, infection, bloating, recurrence, pain, bowel injuries. We will schedule in 2-3 weeks at her convenience. I spent 40 minutes in this encounter including coordination of her care, placing orders, personally  reviewing imaging studies and performing appropriate documentation   Seanna Sisler, MD FACS General Surgeon 

## 2022-12-03 ENCOUNTER — Encounter
Admission: RE | Admit: 2022-12-03 | Discharge: 2022-12-03 | Disposition: A | Discharge status: Home / Self Care | Payer: Medicaid Other | Source: Ambulatory Visit | Attending: Surgery | Admitting: Surgery

## 2022-12-03 DIAGNOSIS — Z01818 Encounter for other preprocedural examination: Secondary | ICD-10-CM

## 2022-12-03 HISTORY — DX: Umbilical hernia without obstruction or gangrene: K42.9

## 2022-12-03 NOTE — Patient Instructions (Signed)
Your procedure is scheduled on:12-11-22 Tuesday Report to the Registration Desk on the 1st floor of the [facility].Then proceed to the 2nd floor Surgery Desk To find out your arrival time, please call [phone] between 1PM - 3PM on:12-10-22 Monday If your arrival time is 6:00 am, do not arrive before that time as the [facility] entrance doors do not open until 6:00 am.  REMEMBER: Instructions that are not followed completely may result in serious medical risk, up to and including death; or upon the discretion of your surgeon and anesthesiologist your surgery may need to be rescheduled.  Do not eat food after midnight the night before surgery.  No gum chewing or hard candies.  You may however, drink CLEAR liquids up to 2 hours before you are scheduled to arrive for your surgery. Do not drink anything within 2 hours of your scheduled arrival time.  Clear liquids include: - water  - apple juice without pulp - gatorade (not RED colors) - black coffee or tea (Do NOT add milk or creamers to the coffee or tea) Do NOT drink anything that is not on this list.  One week prior to surgery:Last dose will be on 12-03-22 Stop Anti-inflammatories (NSAIDS) such as Advil, Aleve, Ibuprofen, Motrin, Naproxen, Naprosyn and Aspirin based products such as Excedrin, Goody's Powder, BC Powder.You may however, take Tylenol if needed for pain up until the day of surgery.  Stop ANY OVER THE COUNTER supplements/vitamins NOW (12-03-22) until after surgery.  TAKE ONLY THESE MEDICATIONS THE MORNING OF SURGERY WITH A SIP OF WATER: -FLUoxetine (PROZAC)   Use your Albuterol Inhaler the morning of surgery and bring your Albuterol Inhaler to the hospital  No Alcohol for 24 hours before or after surgery.  No Smoking including e-cigarettes for 24 hours before surgery.  No chewable tobacco products for at least 6 hours before surgery.  No nicotine patches on the day of surgery.  Do not use any "recreational" drugs  for at least a week (preferably 2 weeks) before your surgery.  Please be advised that the combination of cocaine and anesthesia may have negative outcomes, up to and including death. If you test positive for cocaine, your surgery will be cancelled.  On the morning of surgery brush your teeth with toothpaste and water, you may rinse your mouth with mouthwash if you wish. Do not swallow any toothpaste or mouthwash.  Use CHG Soap as directed on instruction sheet.  Do not wear jewelry, make-up, hairpins, clips or nail polish.  Do not wear lotions, powders, or perfumes.   Do not shave body hair from the neck down 48 hours before surgery.  Contact lenses, hearing aids and dentures may not be worn into surgery.  Do not bring valuables to the hospital. [facility] is not responsible for any missing/lost belongings or valuables.   Notify your doctor if there is any change in your medical condition (cold, fever, infection).  Wear comfortable clothing (specific to your surgery type) to the hospital.  After surgery, you can help prevent lung complications by doing breathing exercises.  Take deep breaths and cough every 1-2 hours. Your doctor may order a device called an Incentive Spirometer to help you take deep breaths. When coughing or sneezing, hold a pillow firmly against your incision with both hands. This is called "splinting." Doing this helps protect your incision. It also decreases belly discomfort.  If you are being admitted to the hospital overnight, leave your suitcase in the car. After surgery it may  be brought to your room.  In case of increased patient census, it may be necessary for you, the patient, to continue your postoperative care in the Same Day Surgery department.  If you are being discharged the day of surgery, you will not be allowed to drive home. You will need a responsible individual to drive you home and stay with you for 24 hours after surgery.   If you are taking  public transportation, you will need to have a responsible individual with you.  Please call the [facility] Dept. at [phone] if you have any questions about these instructions.  Surgery Visitation Policy:  Patients undergoing a surgery or procedure may have two family members or support persons with them as long as the person is not COVID-19 positive or experiencing its symptoms.   Due to an increase in RSV and influenza rates and associated hospitalizations, children ages 67 and under will not be able to visit patients in [facility]. Masks continue to be strongly recommended.

## 2022-12-10 MED ORDER — FAMOTIDINE 20 MG PO TABS: 20.0000 mg | ORAL_TABLET | Freq: Once | ORAL | Status: AC

## 2022-12-10 MED ORDER — CEFAZOLIN SODIUM-DEXTROSE 2-4 GM/100ML-% IV SOLN
2.0000 g | INTRAVENOUS | Status: AC
  Administered 2022-12-11: 2 g via INTRAVENOUS

## 2022-12-10 MED ORDER — ORAL CARE MOUTH RINSE: 15.0000 mL | Freq: Once | OROMUCOSAL | Status: AC

## 2022-12-10 MED ORDER — CHLORHEXIDINE GLUCONATE 0.12 % MT SOLN: 15.0000 mL | Freq: Once | OROMUCOSAL | Status: AC

## 2022-12-10 MED ORDER — GABAPENTIN 300 MG PO CAPS: 300.0000 mg | ORAL_CAPSULE | ORAL | Status: AC

## 2022-12-10 MED ORDER — ACETAMINOPHEN 500 MG PO TABS: 1000.0000 mg | ORAL_TABLET | ORAL | Status: AC

## 2022-12-10 MED ORDER — CHLORHEXIDINE GLUCONATE CLOTH 2 % EX PADS: 6.0000 | MEDICATED_PAD | Freq: Once | CUTANEOUS | Status: DC

## 2022-12-10 MED ORDER — LACTATED RINGERS IV SOLN: INTRAVENOUS | Status: DC

## 2022-12-11 ENCOUNTER — Other Ambulatory Visit: Payer: Self-pay

## 2022-12-11 ENCOUNTER — Encounter
Admission: RE | Disposition: A | Discharge status: Home / Self Care | Payer: Self-pay | Source: Ambulatory Visit | Attending: Surgery

## 2022-12-11 ENCOUNTER — Encounter: Payer: Self-pay | Admitting: Surgery

## 2022-12-11 ENCOUNTER — Ambulatory Visit: Payer: Medicaid Other | Admitting: Urgent Care

## 2022-12-11 ENCOUNTER — Ambulatory Visit
Admission: RE | Admit: 2022-12-11 | Discharge: 2022-12-11 | Disposition: A | Discharge status: Home / Self Care | Payer: Medicaid Other | Source: Ambulatory Visit | Attending: Surgery | Admitting: Surgery

## 2022-12-11 DIAGNOSIS — J45909 Unspecified asthma, uncomplicated: Secondary | ICD-10-CM | POA: Insufficient documentation

## 2022-12-11 DIAGNOSIS — K429 Umbilical hernia without obstruction or gangrene: Secondary | ICD-10-CM | POA: Diagnosis not present

## 2022-12-11 DIAGNOSIS — Z01818 Encounter for other preprocedural examination: Secondary | ICD-10-CM

## 2022-12-11 HISTORY — PX: INSERTION OF MESH: SHX5868

## 2022-12-11 HISTORY — PX: VENTRAL HERNIA REPAIR: SHX424

## 2022-12-11 LAB — POCT PREGNANCY, URINE: Preg Test, Ur: NEGATIVE

## 2022-12-11 SURGERY — REPAIR, HERNIA, VENTRAL
Anesthesia: General | Site: Abdomen

## 2022-12-11 MED ORDER — LIDOCAINE HCL (PF) 2 % IJ SOLN
INTRAMUSCULAR | Status: AC
  Filled 2022-12-11: qty 5

## 2022-12-11 MED ORDER — SEVOFLURANE IN SOLN
RESPIRATORY_TRACT | Status: AC
  Filled 2022-12-11: qty 250

## 2022-12-11 MED ORDER — PROMETHAZINE HCL 25 MG/ML IJ SOLN
6.2500 mg | INTRAMUSCULAR | Status: DC | PRN
  Administered 2022-12-11: 6.25 mg via INTRAVENOUS

## 2022-12-11 MED ORDER — CHLORHEXIDINE GLUCONATE 0.12 % MT SOLN
OROMUCOSAL | Status: AC
  Administered 2022-12-11: 15 mL via OROMUCOSAL
  Filled 2022-12-11: qty 15

## 2022-12-11 MED ORDER — ROCURONIUM BROMIDE 100 MG/10ML IV SOLN
INTRAVENOUS | Status: DC | PRN
  Administered 2022-12-11: 10 mg via INTRAVENOUS
  Administered 2022-12-11: 30 mg via INTRAVENOUS

## 2022-12-11 MED ORDER — DEXAMETHASONE SODIUM PHOSPHATE 10 MG/ML IJ SOLN
INTRAMUSCULAR | Status: AC
  Filled 2022-12-11: qty 1

## 2022-12-11 MED ORDER — ROCURONIUM BROMIDE 10 MG/ML (PF) SYRINGE
PREFILLED_SYRINGE | INTRAVENOUS | Status: AC
  Filled 2022-12-11: qty 10

## 2022-12-11 MED ORDER — PROMETHAZINE HCL 25 MG/ML IJ SOLN
INTRAMUSCULAR | Status: AC
  Filled 2022-12-11: qty 1

## 2022-12-11 MED ORDER — BUPIVACAINE HCL (PF) 0.25 % IJ SOLN
INTRAMUSCULAR | Status: AC
  Filled 2022-12-11: qty 30

## 2022-12-11 MED ORDER — MIDAZOLAM HCL 2 MG/2ML IJ SOLN
INTRAMUSCULAR | Status: DC | PRN
  Administered 2022-12-11: 2 mg via INTRAVENOUS

## 2022-12-11 MED ORDER — DEXMEDETOMIDINE HCL IN NACL 80 MCG/20ML IV SOLN
INTRAVENOUS | Status: DC | PRN
  Administered 2022-12-11: 4 ug via BUCCAL
  Administered 2022-12-11: 8 ug via BUCCAL

## 2022-12-11 MED ORDER — BUPIVACAINE-EPINEPHRINE (PF) 0.25% -1:200000 IJ SOLN
INTRAMUSCULAR | Status: DC | PRN
  Administered 2022-12-11: 30 mL

## 2022-12-11 MED ORDER — HYDROCODONE-ACETAMINOPHEN 5-325 MG PO TABS: 1.0000 | ORAL_TABLET | ORAL | 0 refills | Status: DC | PRN

## 2022-12-11 MED ORDER — LIDOCAINE HCL (CARDIAC) PF 100 MG/5ML IV SOSY
PREFILLED_SYRINGE | INTRAVENOUS | Status: DC | PRN
  Administered 2022-12-11: 100 mg via INTRAVENOUS

## 2022-12-11 MED ORDER — DROPERIDOL 2.5 MG/ML IJ SOLN
INTRAMUSCULAR | Status: AC
  Administered 2022-12-11: 0.625 mg via INTRAVENOUS
  Filled 2022-12-11: qty 2

## 2022-12-11 MED ORDER — PROPOFOL 10 MG/ML IV BOLUS
INTRAVENOUS | Status: DC | PRN
  Administered 2022-12-11: 150 mg via INTRAVENOUS

## 2022-12-11 MED ORDER — FENTANYL CITRATE (PF) 100 MCG/2ML IJ SOLN: 25.0000 ug | INTRAMUSCULAR | Status: DC | PRN

## 2022-12-11 MED ORDER — FENTANYL CITRATE (PF) 100 MCG/2ML IJ SOLN
INTRAMUSCULAR | Status: DC | PRN
  Administered 2022-12-11: 100 ug via INTRAVENOUS

## 2022-12-11 MED ORDER — DROPERIDOL 2.5 MG/ML IJ SOLN: 0.6250 mg | Freq: Once | INTRAMUSCULAR | Status: AC

## 2022-12-11 MED ORDER — FAMOTIDINE 20 MG PO TABS
ORAL_TABLET | ORAL | Status: AC
  Administered 2022-12-11: 20 mg via ORAL
  Filled 2022-12-11: qty 1

## 2022-12-11 MED ORDER — BUPIVACAINE LIPOSOME 1.3 % IJ SUSP
INTRAMUSCULAR | Status: DC | PRN
  Administered 2022-12-11: 20 mL

## 2022-12-11 MED ORDER — DROPERIDOL 2.5 MG/ML IJ SOLN
0.6250 mg | Freq: Once | INTRAMUSCULAR | Status: DC
  Filled 2022-12-11: qty 2

## 2022-12-11 MED ORDER — SUGAMMADEX SODIUM 200 MG/2ML IV SOLN
INTRAVENOUS | Status: DC | PRN
  Administered 2022-12-11: 150 mg via INTRAVENOUS

## 2022-12-11 MED ORDER — ONDANSETRON HCL 4 MG/2ML IJ SOLN
INTRAMUSCULAR | Status: DC | PRN
  Administered 2022-12-11: 4 mg via INTRAVENOUS

## 2022-12-11 MED ORDER — ACETAMINOPHEN 10 MG/ML IV SOLN
INTRAVENOUS | Status: AC
  Filled 2022-12-11: qty 100

## 2022-12-11 MED ORDER — GABAPENTIN 300 MG PO CAPS
ORAL_CAPSULE | ORAL | Status: AC
  Administered 2022-12-11: 300 mg via ORAL
  Filled 2022-12-11: qty 1

## 2022-12-11 MED ORDER — EPINEPHRINE PF 1 MG/ML IJ SOLN
INTRAMUSCULAR | Status: AC
  Filled 2022-12-11: qty 1

## 2022-12-11 MED ORDER — CEFAZOLIN SODIUM-DEXTROSE 2-4 GM/100ML-% IV SOLN
INTRAVENOUS | Status: AC
  Filled 2022-12-11: qty 100

## 2022-12-11 MED ORDER — DEXAMETHASONE SODIUM PHOSPHATE 10 MG/ML IJ SOLN
INTRAMUSCULAR | Status: DC | PRN
  Administered 2022-12-11: 10 mg via INTRAVENOUS

## 2022-12-11 MED ORDER — ACETAMINOPHEN 500 MG PO TABS
ORAL_TABLET | ORAL | Status: AC
  Administered 2022-12-11: 1000 mg via ORAL
  Filled 2022-12-11: qty 2

## 2022-12-11 MED ORDER — BUPIVACAINE LIPOSOME 1.3 % IJ SUSP
INTRAMUSCULAR | Status: AC
  Filled 2022-12-11: qty 20

## 2022-12-11 MED ORDER — ONDANSETRON HCL 4 MG/2ML IJ SOLN
INTRAMUSCULAR | Status: AC
  Filled 2022-12-11: qty 2

## 2022-12-11 MED ORDER — FENTANYL CITRATE (PF) 100 MCG/2ML IJ SOLN
INTRAMUSCULAR | Status: AC
  Filled 2022-12-11: qty 2

## 2022-12-11 MED ORDER — PROPOFOL 1000 MG/100ML IV EMUL
INTRAVENOUS | Status: AC
  Filled 2022-12-11: qty 100

## 2022-12-11 MED ORDER — MIDAZOLAM HCL 2 MG/2ML IJ SOLN
INTRAMUSCULAR | Status: AC
  Filled 2022-12-11: qty 2

## 2022-12-11 SURGICAL SUPPLY — 37 items
ADH SKN CLS APL DERMABOND .7 (GAUZE/BANDAGES/DRESSINGS) ×1
APL PRP STRL LF DISP 70% ISPRP (MISCELLANEOUS) ×1
APPLIER CLIP 11 MED OPEN (CLIP)
APPLIER CLIP 13 LRG OPEN (CLIP)
APR CLP LRG 13 20 CLIP (CLIP)
APR CLP MED 11 20 MLT OPN (CLIP)
CHLORAPREP W/TINT 26 (MISCELLANEOUS) ×1 IMPLANT
CLIP APPLIE 11 MED OPEN (CLIP) ×1 IMPLANT
CLIP APPLIE 13 LRG OPEN (CLIP) ×1 IMPLANT
DERMABOND ADVANCED .7 DNX12 (GAUZE/BANDAGES/DRESSINGS) IMPLANT
DRAPE LAPAROTOMY 100X77 ABD (DRAPES) ×1 IMPLANT
DRSG TELFA 3X8 NADH STRL (GAUZE/BANDAGES/DRESSINGS) ×1 IMPLANT
ELECT CAUTERY BLADE 6.4 (BLADE) ×1 IMPLANT
ELECT REM PT RETURN 9FT ADLT (ELECTROSURGICAL) ×1
ELECTRODE REM PT RTRN 9FT ADLT (ELECTROSURGICAL) ×1 IMPLANT
GAUZE 4X4 16PLY ~~LOC~~+RFID DBL (SPONGE) ×1 IMPLANT
GAUZE SPONGE 4X4 12PLY STRL (GAUZE/BANDAGES/DRESSINGS) ×1 IMPLANT
GLOVE BIO SURGEON STRL SZ7 (GLOVE) ×1 IMPLANT
GOWN STRL REUS W/ TWL LRG LVL3 (GOWN DISPOSABLE) ×2 IMPLANT
GOWN STRL REUS W/TWL LRG LVL3 (GOWN DISPOSABLE) ×2
MANIFOLD NEPTUNE II (INSTRUMENTS) ×1 IMPLANT
MESH VENTRALEX ST 2.5 CRC MED (Mesh General) IMPLANT
NDL HYPO 22X1.5 SAFETY MO (MISCELLANEOUS) ×1 IMPLANT
NEEDLE HYPO 22X1.5 SAFETY MO (MISCELLANEOUS) ×2 IMPLANT
PACK BASIN MINOR ARMC (MISCELLANEOUS) ×1 IMPLANT
SPONGE T-LAP 18X18 ~~LOC~~+RFID (SPONGE) ×1 IMPLANT
STAPLER SKIN PROX 35W (STAPLE) ×1 IMPLANT
SUT ETHIBOND 0 MO6 C/R (SUTURE) ×1 IMPLANT
SUT MNCRL 4-0 (SUTURE) ×1
SUT MNCRL 4-0 27XMFL (SUTURE) ×1
SUT VIC AB 2-0 SH 27 (SUTURE)
SUT VIC AB 2-0 SH 27XBRD (SUTURE) ×2 IMPLANT
SUTURE MNCRL 4-0 27XMF (SUTURE) IMPLANT
SYR 20ML LL LF (SYRINGE) ×1 IMPLANT
TAPE MICROFOAM 4IN (TAPE) ×1 IMPLANT
TRAP FLUID SMOKE EVACUATOR (MISCELLANEOUS) ×1 IMPLANT
WATER STERILE IRR 500ML POUR (IV SOLUTION) ×1 IMPLANT

## 2022-12-11 NOTE — Transfer of Care (Signed)
Immediate Anesthesia Transfer of Care Note  Patient: April Foster  Procedure(s) Performed: HERNIA REPAIR VENTRAL ADULT, open (Abdomen) INSERTION OF MESH (Abdomen)  Patient Location: PACU  Anesthesia Type:General  Level of Consciousness: awake, sedated, and confused  Airway & Oxygen Therapy: Patient Spontanous Breathing and Patient connected to nasal cannula oxygen  Post-op Assessment: Report given to RN and Post -op Vital signs reviewed and stable  Post vital signs: Reviewed and stable  Last Vitals:  Vitals Value Taken Time  BP 145/91 12/11/22 1347  Temp    Pulse 86 12/11/22 1355  Resp 28 12/11/22 1355  SpO2 100 % 12/11/22 1355  Vitals shown include unvalidated device data.  Last Pain:  Vitals:   12/11/22 0845  TempSrc: Oral  PainSc:          Complications: No notable events documented.

## 2022-12-11 NOTE — Op Note (Signed)
Umbilical Hernia Repair with Mesh  Pre-operative Diagnosis: umbilical hernia  Post-operative Diagnosis: same  Surgeon: Caroleen Hamman, MD FACS  Anesthesia: Gen. with endotracheal tube   Findings: 3 cm UH  Estimated Blood Loss: 5cc                 Specimens: sac          Complications: none              Procedure Details  The patient was seen again in the Holding Room. The benefits, complications, treatment options, and expected outcomes were discussed with the patient. The risks of bleeding, infection, recurrence of symptoms, failure to resolve symptoms, bowel injury, mesh placement, mesh infection, any of which could require further surgery were reviewed with the patient. The likelihood of improving the patient's symptoms with return to their baseline status is good.  The patient and/or family concurred with the proposed plan, giving informed consent.  The patient was taken to Operating Room, identified and the procedure verified.  A Time Out was held and the above information confirmed.  Prior to the induction of general anesthesia, antibiotic prophylaxis was administered. VTE prophylaxis was in place. General endotracheal anesthesia was then administered and tolerated well. After the induction, the abdomen was prepped with Chloraprep and draped in the sterile fashion. The patient was positioned in the supine position.  Incision was created with a scalpel over the hernia defect. Electrocautery was used to dissect through subcutaneous tissue, the hernia sac was opened excised.   The hernia was measured. A BARD 6.4cm Ventralex  patch selected and secure to the fascia. I closed the hernia defect with interrupted 0 Ethibond sutures.   Incision was closed in a 2 layer fashion with 3-0 Vicryl and 4-0 Monocryl. Dermabond was used to coat the skin. Marcaine quarter percent with epinephrine and lidocaine 1% was used to inject all the incision sites. Patient tolerated procedure well and there  were no immediate complications. Needle and laparotomy counts were correct

## 2022-12-11 NOTE — Progress Notes (Signed)
Patient was delirious/ scared upon arrival to pacu. April [place], CRNA gave more precedex and patient was able to rest comfortably.    1453- nausea has subsided, patient denies pain. Has eaten saltine and graham crackers.

## 2022-12-11 NOTE — Anesthesia Preprocedure Evaluation (Signed)
Anesthesia Evaluation  Patient identified by MRN, date of birth, ID band Patient awake    Reviewed: Allergy & Precautions, NPO status , Patient's Chart, lab work & pertinent test results  History of Anesthesia Complications Negative for: history of anesthetic complications  Airway Mallampati: II  TM Distance: >3 FB Neck ROM: full    Dental  (+) Dental Advidsory Given, Teeth Intact   Pulmonary neg shortness of breath, asthma , neg sleep apnea, neg COPD, neg recent URI   Pulmonary exam normal        Cardiovascular negative cardio ROS Normal cardiovascular exam     Neuro/Psych  PSYCHIATRIC DISORDERS Anxiety Depression    negative neurological ROS     GI/Hepatic Neg liver ROS, hiatal hernia,GERD  Controlled,,  Endo/Other  negative endocrine ROS    Renal/GU      Musculoskeletal   Abdominal Normal abdominal exam  (+)   Peds  Hematology negative hematology ROS (+)   Anesthesia Other Findings paraesophageal hernia  Past Medical History: No date: Anxiety No date: Asthma No date: Depression No date: GERD (gastroesophageal reflux disease) No date: Headache No date: History of hiatal hernia No date: Vitamin D deficiency  Past Surgical History: 2020: KNEE ARTHROSCOPY W/ LATERAL RELEASE; Right No date: TONSILLECTOMY     Comment:  at 27 years old No date: WISDOM TOOTH EXTRACTION     Comment:  2019     Reproductive/Obstetrics negative OB ROS                             Anesthesia Physical Anesthesia Plan  ASA: 2  Anesthesia Plan: General   Post-op Pain Management: Gabapentin PO (pre-op)*, Tylenol PO (pre-op)* and Celebrex PO (pre-op)*   Induction: Intravenous  PONV Risk Score and Plan: Ondansetron, Dexamethasone, Midazolam and Scopolamine patch - Pre-op  Airway Management Planned: Oral ETT  Additional Equipment:   Intra-op Plan:   Post-operative Plan: Extubation in  OR  Informed Consent: I have reviewed the patients History and Physical, chart, labs and discussed the procedure including the risks, benefits and alternatives for the proposed anesthesia with the patient or authorized representative who has indicated his/her understanding and acceptance.     Dental Advisory Given  Plan Discussed with: Anesthesiologist, CRNA and Surgeon  Anesthesia Plan Comments:         Anesthesia Quick Evaluation

## 2022-12-11 NOTE — Interval H&P Note (Signed)
History and Physical Interval Note:  12/11/2022 12:07 PM  April Foster  has presented today for surgery, with the diagnosis of ventral hernia 3 cm.  The various methods of treatment have been discussed with the patient and family. After consideration of risks, benefits and other options for treatment, the patient has consented to  Procedure(s): HERNIA REPAIR VENTRAL ADULT, open (N/A) as a surgical intervention.  The patient's history has been reviewed, patient examined, no change in status, stable for surgery.  I have reviewed the patient's chart and labs.  Questions were answered to the patient's satisfaction.     [place]

## 2022-12-11 NOTE — Anesthesia Procedure Notes (Signed)
Procedure Name: Intubation Date/Time: 12/11/2022 12:21 PM  Performed by: Johnna Acosta, CRNAPre-anesthesia Checklist: Patient identified, Emergency Drugs available, Suction available, Patient being monitored and Timeout performed Patient Re-evaluated:Patient Re-evaluated prior to induction Oxygen Delivery Method: Circle system utilized Preoxygenation: Pre-oxygenation with 100% oxygen Induction Type: IV induction Ventilation: Mask ventilation without difficulty Laryngoscope Size: McGraph and 3 Grade View: Grade I Tube type: Oral Tube size: 6.5 mm Number of attempts: 1 Airway Equipment and Method: Stylet and Video-laryngoscopy Placement Confirmation: ETT inserted through vocal cords under direct vision, positive ETCO2 and breath sounds checked- equal and bilateral Secured at: 21 cm Tube secured with: Tape Dental Injury: Teeth and Oropharynx as per pre-operative assessment

## 2022-12-11 NOTE — Discharge Instructions (Addendum)
Open Hernia Repair, Adult, Care After The following information offers guidance on how to care for yourself after your procedure. Your health care provider may also give you more specific instructions. If you have problems or questions, contact your health care provider. What can I expect after the procedure? After the procedure, it is common to have: Mild discomfort. Slight bruising. Minor swelling. Pain in the abdomen. A small amount of blood from the incision. Follow these instructions at home: Medicines Take over-the-counter and prescription medicines only as told by your health care provider. Ask your health care provider if the medicine prescribed to you: Requires you to avoid driving or using machinery. Can cause constipation. You may need to take these actions to prevent or treat constipation: Drink enough fluid to keep your urine pale yellow. Take over-the-counter or prescription medicines. Eat foods that are high in fiber, such as beans, whole grains, and fresh fruits and vegetables. Limit foods that are high in fat and processed sugars, such as fried or sweet foods. If you were prescribed an antibiotic medicine, take it as told by your health care provider. Do not stop using the antibiotic even if you start to feel better. Incision care  Follow instructions from your health care provider about how to take care of your incision. Make sure you: Wash your hands with soap and water for at least 20 seconds before and after you change your bandage (dressing). If soap and water are not available, use hand sanitizer. Change your dressing as told by your health care provider. Leave stitches (sutures), skin glue, or adhesive strips in place. These skin closures may need to stay in place for 2 weeks or longer. If adhesive strip edges start to loosen and curl up, you may trim the loose edges. Do not remove adhesive strips completely unless your health care provider tells you to do  that. Check your incision area every day for signs of infection. Check for: More redness, swelling, or pain. More fluid or blood. Warmth. Pus or a bad smell. Wear loose, soft clothing while your incision heals. Activity  Rest as told by your health care provider. Do not lift anything that is heavier than 10 lb (4.5 kg), or the limit that you are told, until your health care provider says that it is safe. Do not play contact sports until your health care provider says that this is safe. If you were given a sedative during the procedure, it can affect you for several hours. Do not drive or operate machinery until your health care provider says that it is safe. Return to your normal activities as told by your health care provider. Ask your health care provider what activities are safe for you. General instructions Do not take baths, swim, or use a hot tub until your health care provider approves. Ask your health care provider if you may take showers. You may only be allowed to take sponge baths. Hold a pillow over your abdomen when you cough or sneeze. This helps with pain. Do not use any products that contain nicotine or tobacco. These products include cigarettes, chewing tobacco, and vaping devices, such as e-cigarettes. If you need help quitting, ask your health care provider. Keep all follow-up visits. This is important. Contact a health care provider if: You have any of these signs of infection: More redness, swelling, or pain around your incision. More fluid or blood coming from your incision. Warmth coming from your incision. Pus or a bad smell coming from your  incision. A fever or chills. You have blood in your stool (feces). You have not had a bowel movement in 2-3 days. Your pain is not controlled with medicine. Get help right away if: You have chest pain or shortness of breath. You feel faint or light-headed. You have severe pain. You vomit and your pain is worse. You have  pain, swelling, or redness in a leg. These symptoms may represent a serious problem that is an emergency. Do not wait to see if the symptoms will go away. Get medical help right away. Call your local emergency services (911 in the [place] Do not drive yourself to the hospital. Summary After an open hernia repair, it is common to have mild discomfort, slight bruising, and minor swelling. Follow instructions from your health care provider about how to take care of your incision. Check every day for signs of infection. Do not lift heavy objects or play contact sports until your health care provider says it is safe. Return to your normal activities as told by your health care provider. Ask your health care provider what activities are safe for you. This information is not intended to replace advice given to you by your health care provider. Make sure you discuss any questions you have with your health care provider. Document Revised: 04/18/2020 Document Reviewed: 04/18/2020 Elsevier Patient Education  [place]   The drugs that you were given will stay in your system until tomorrow so for the next 24 hours you should not:  Drive an automobile Make any legal decisions Drink any alcoholic beverage   You may resume regular meals tomorrow.  Today it is better to start with liquids and gradually work up to solid foods.  You may eat anything you prefer, but it is better to start with liquids, then soup and crackers, and gradually work up to solid foods.   Please notify your doctor immediately if you have any unusual bleeding, trouble breathing, redness and pain at the surgery site, drainage, fever, or pain not relieved by medication.    Additional Instructions:  PLEASE LEAVE TEAL (EXPAREL) ARMBAND ON FOR 4 DAYS    Please contact your physician with any problems or Same Day Surgery at [phone], Monday through Friday 6 am to 4 pm, or  [facility] number at [phone].

## 2022-12-12 ENCOUNTER — Encounter: Payer: Self-pay | Admitting: Surgery

## 2022-12-12 LAB — SURGICAL PATHOLOGY

## 2022-12-12 NOTE — Anesthesia Postprocedure Evaluation (Signed)
Anesthesia Post Note  Patient: April Foster  Procedure(s) Performed: HERNIA REPAIR VENTRAL ADULT, open (Abdomen) INSERTION OF MESH (Abdomen)  Patient location during evaluation: PACU Anesthesia Type: General Level of consciousness: awake and alert Pain management: pain level controlled Vital Signs Assessment: post-procedure vital signs reviewed and stable Respiratory status: spontaneous breathing, nonlabored ventilation, respiratory function stable and patient connected to nasal cannula oxygen Cardiovascular status: blood pressure returned to baseline and stable Postop Assessment: no apparent nausea or vomiting Anesthetic complications: no   No notable events documented.   Last Vitals:  Vitals:   12/11/22 1445 12/11/22 1507  BP: 111/74 117/76  Pulse: 96 90  Resp: 12 16  Temp:  (!) 36.2 C  SpO2: 100% 94%    Last Pain:  Vitals:   12/12/22 0857  TempSrc:   PainSc: 1                  Martha Clan

## 2022-12-17 ENCOUNTER — Encounter: Payer: Self-pay | Admitting: Surgery

## 2022-12-20 ENCOUNTER — Encounter: Payer: Self-pay | Admitting: Physician Assistant

## 2022-12-20 ENCOUNTER — Ambulatory Visit (INDEPENDENT_AMBULATORY_CARE_PROVIDER_SITE_OTHER): Payer: Medicaid Other | Admitting: Physician Assistant

## 2022-12-20 VITALS — BP 112/71 | HR 75 | Temp 98.1°F | Ht 62.0 in | Wt 144.0 lb

## 2022-12-20 DIAGNOSIS — K429 Umbilical hernia without obstruction or gangrene: Secondary | ICD-10-CM

## 2022-12-20 DIAGNOSIS — Z09 Encounter for follow-up examination after completed treatment for conditions other than malignant neoplasm: Secondary | ICD-10-CM

## 2022-12-20 NOTE — Progress Notes (Signed)
[name] POST-OP OFFICE VISIT  12/20/2022  HPI: April Foster is a 27 y.o. female 9 days s/p umbilical hernia repair with Dr Dahlia Byes   She is overall doing well She did notice some firmness at umbilicus and wanted to be certain this was nothing Pain is doing well Did have an isolated episode of emesis today but otherwise feels fine Constipated; tried home remedies without improvements Incision is healing well   Vital signs: BP 112/71   Pulse 75   Temp 98.1 F (36.7 C) (Oral)   Ht 5\' 2"  (1.575 m)   Wt 144 lb (65.3 kg)   LMP 12/02/2022 (Exact Date) Comment: UPT neg DOS  SpO2 100%   BMI 26.34 kg/m    Physical Exam: Constitutional: Well appearing female, NAD Abdomen: Soft, expected mild incisional soreness at umbilicus, non-distended, no rebound/guarding Skin: Umbilical incision is healing well; there is an expected degree of induration consistent with healing, no evidence of abscess, seroma, nor hematoma  Assessment/Plan: This is a 27 y.o. female 6 days s/p umbilical hernia repair with Dr Dahlia Byes    - Add Miralax 1-2x daily for constipation  - Offered antiemetics for emesis but feels better, no nausea, and has some at home  - Pain control prn  - Reviewed wound care recommendation  - Reviewed lifting restrictions; 6 weeks total  - She has scheduled follow up on 04/15 she would like to keep for now; She understands to call with questions/concerns in the interim   -- Edison Simon, PA-C [facility] 12/20/2022, 4:08 PM M-F: 7am - 4pm

## 2022-12-20 NOTE — Patient Instructions (Signed)

## 2022-12-31 ENCOUNTER — Encounter: Payer: Self-pay | Admitting: Surgery

## 2022-12-31 ENCOUNTER — Ambulatory Visit (INDEPENDENT_AMBULATORY_CARE_PROVIDER_SITE_OTHER): Payer: Medicaid Other | Admitting: Surgery

## 2022-12-31 VITALS — BP 119/79 | HR 78 | Temp 98.4°F | Ht 62.0 in | Wt 142.0 lb

## 2022-12-31 DIAGNOSIS — K429 Umbilical hernia without obstruction or gangrene: Secondary | ICD-10-CM

## 2022-12-31 DIAGNOSIS — Z09 Encounter for follow-up examination after completed treatment for conditions other than malignant neoplasm: Secondary | ICD-10-CM

## 2022-12-31 NOTE — Progress Notes (Signed)
Outpatient Surgical Follow Up  12/31/2022  April Foster is an 27 y.o. female.   Chief Complaint  Patient presents with   Routine Post Op    HPI: April Foster is 3 weeks out following ventral hernia repair with mesh.  She is doing well.  No fevers no chills.  sHe did have 1 day had some serous drainage.  This subsided.  She has no pain.  She is back to normal.  She is tolerating diet  Past Medical History:  Diagnosis Date   Anxiety    Asthma    Depression    GERD (gastroesophageal reflux disease)    Headache    History of hiatal hernia    Umbilical hernia    Vitamin D deficiency     Past Surgical History:  Procedure Laterality Date   INSERTION OF MESH N/A 03/29/2022   Procedure: INSERTION OF MESH;  Surgeon: Leafy Ro, MD;  Location: [facility];  Service: General;  Laterality: N/A;   INSERTION OF MESH N/A 12/11/2022   Procedure: INSERTION OF MESH;  Surgeon: Leafy Ro, MD;  Location: [facility];  Service: General;  Laterality: N/A;   KNEE ARTHROSCOPY W/ LATERAL RELEASE Right 2020   TONSILLECTOMY     at 27 years old   VENTRAL HERNIA REPAIR N/A 12/11/2022   Procedure: HERNIA REPAIR VENTRAL ADULT, open;  Surgeon: Leafy Ro, MD;  Location: [facility];  Service: General;  Laterality: N/A;   WISDOM TOOTH EXTRACTION     2019   XI ROBOTIC ASSISTED PARAESOPHAGEAL HERNIA REPAIR N/A 03/29/2022   Procedure: XI ROBOTIC ASSISTED PARAESOPHAGEAL HERNIA REPAIR with RNFA to assist;  Surgeon: Leafy Ro, MD;  Location: [facility];  Service: General;  Laterality: N/A;      Social History:  reports that she has never smoked. She has never used smokeless tobacco. She reports current alcohol use of about 1.0 standard drink of alcohol per week. She reports that she does not use drugs.  Allergies:  Allergies  Allergen Reactions   Cholecalciferol Rash   Bupropion Hives   Ibuprofen Nausea And Vomiting and Rash    Medications reviewed.    ROS Full ROS performed and is otherwise negative  other than what is stated in HPI   BP 119/79   Pulse 78   Temp 98.4 F (36.9 C)   Ht 5\' 2"  (1.575 m)   Wt 142 lb (64.4 kg)   LMP 12/02/2022 (Exact Date) Comment: UPT neg DOS  SpO2 100%   BMI 25.97 kg/m   Physical Exam NAD alert Abd: soft, Nt , incision c/d/I, no infection or recurrence    Assessment/Plan: Doing very well from ventral hernia repair without any complications.  No surgical issues at this time.  Will follow her on a as needed basis.Marland Kitchen  please note that I spent 20 minutes in this encounter including personally reviewing records, placing orders and performing appropriate documentation  April Big, MD [facility] General Surgeon

## 2022-12-31 NOTE — Patient Instructions (Signed)

## 2023-01-07 ENCOUNTER — Other Ambulatory Visit: Payer: Self-pay | Admitting: Gastroenterology

## 2023-10-21 IMAGING — RF DG ESOPHAGUS
8 of 11 series · 14 of 24 positions shown · non-contrast
Comparison: CT abdomen/pelvis 01/30/2022.

CLINICAL DATA: Hiatal hernia. Additional history provided by
scanning technologist: Patient reports hiatal hernia,
gastroesophageal reflux, ER visit for unexplained vomiting last
week.

EXAM:
ESOPHAGUS/BARIUM SWALLOW/TABLET STUDY
TECHNIQUE: A combined double and single contrast examination was performed
using effervescent crystals, high-density barium, and thin liquid
barium. This exam was performed by Orellana, Evgeny and was
supervised and interpreted by Yen Pearce, DO.
FLUOROSCOPY:
Fluoroscopy time: 1 minute, 36 seconds
Radiation Exposure Index (as provided by the fluoroscopic device):
13.20 mGy

[Series 1: cp_standard · 0.17mm/px · 2 of 33 frames shown (1 of 8)]
[frame 1/33]
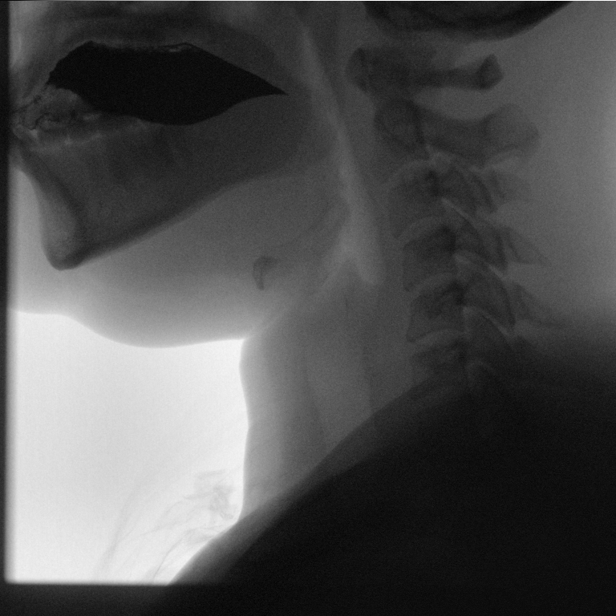
[frame 29/33]
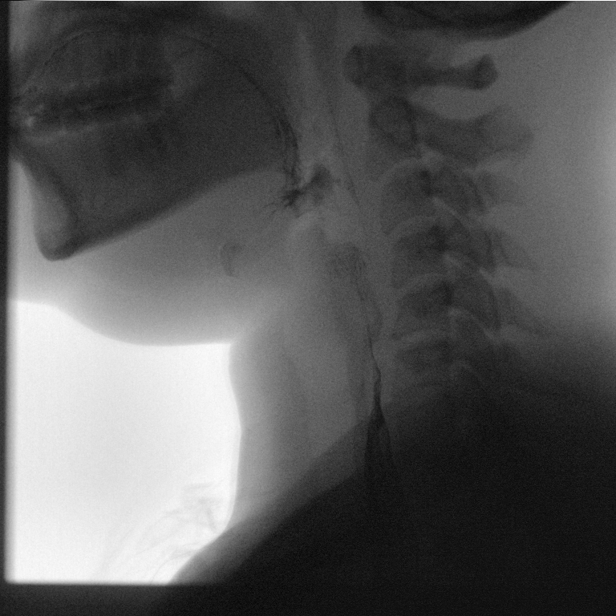

[Series 2: cp_standard · 0.17mm/px · 1 of 45 frames shown (2 of 8)]
[frame 23/45]
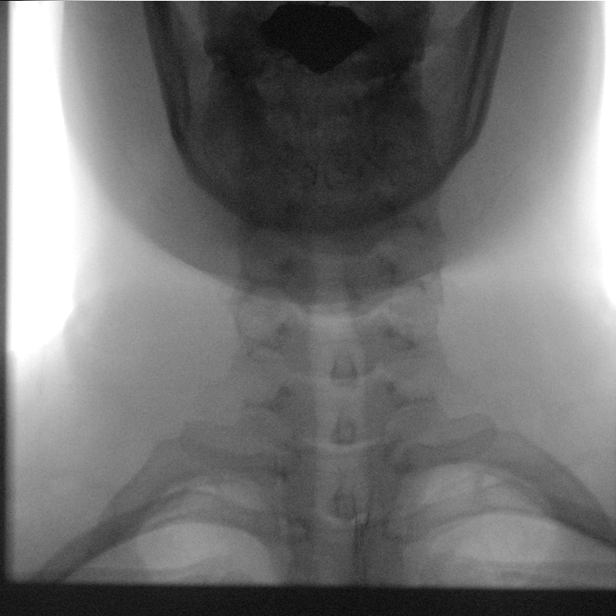

[Series 3: cp_standard · 0.17mm/px · 3 of 64 frames shown (3 of 8)]
[frame 10/64]
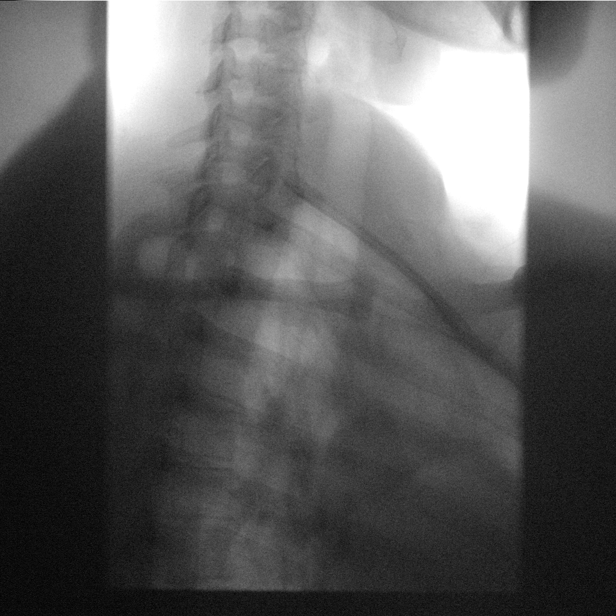
[frame 33/64]
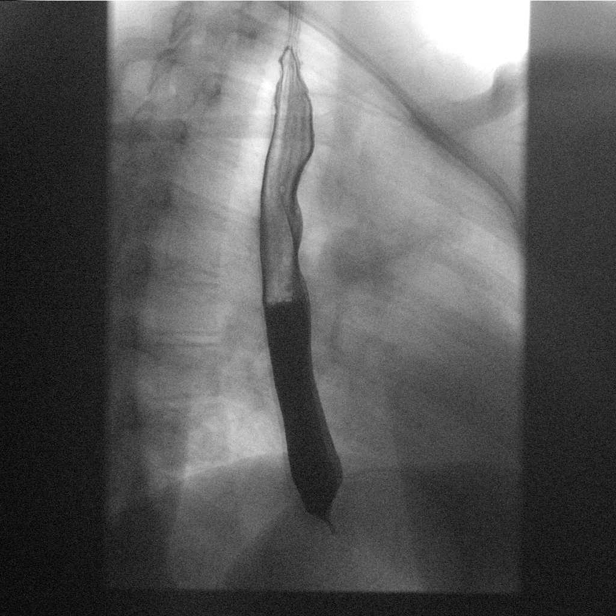
[frame 55/64]
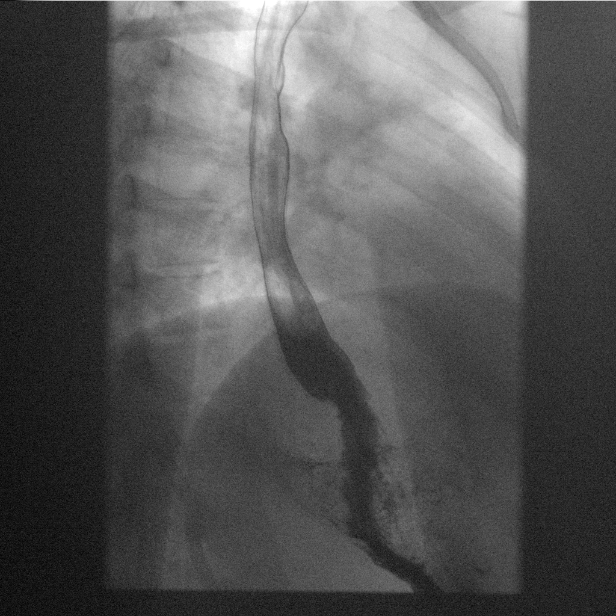

[Series 4: cp_standard · 0.19mm/px · 2 of 58 frames shown (4 of 8)]
[frame 50/58]
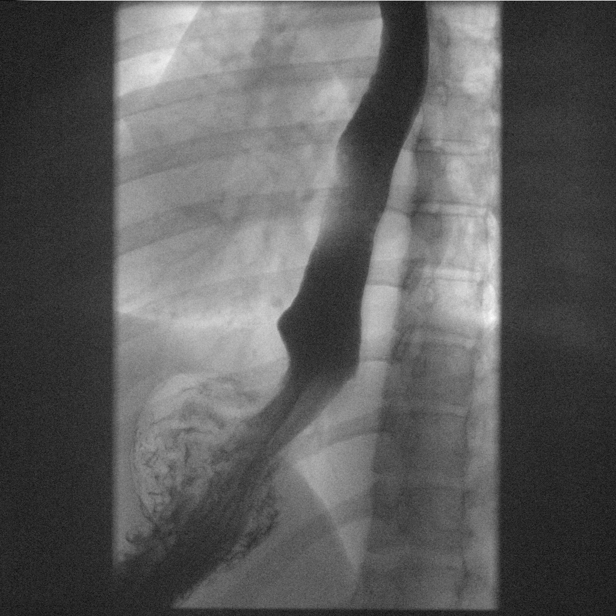
[frame 58/58]
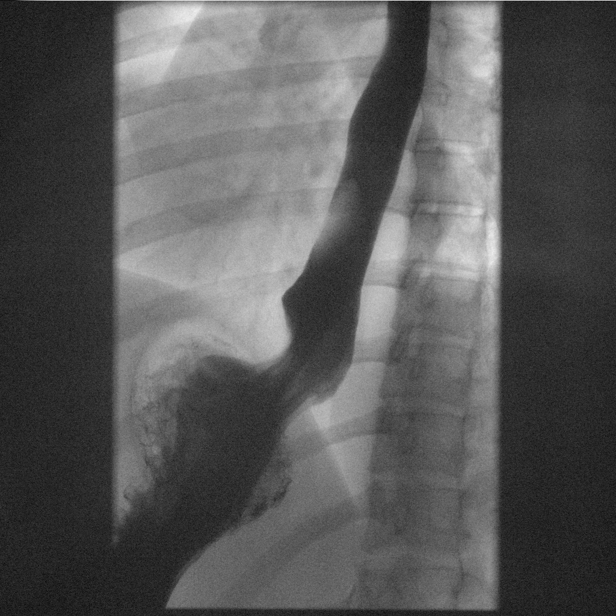

[Series 6: cp_standard · 0.28mm/px · 2 of 34 frames shown (5 of 8)]
[frame 18/34]
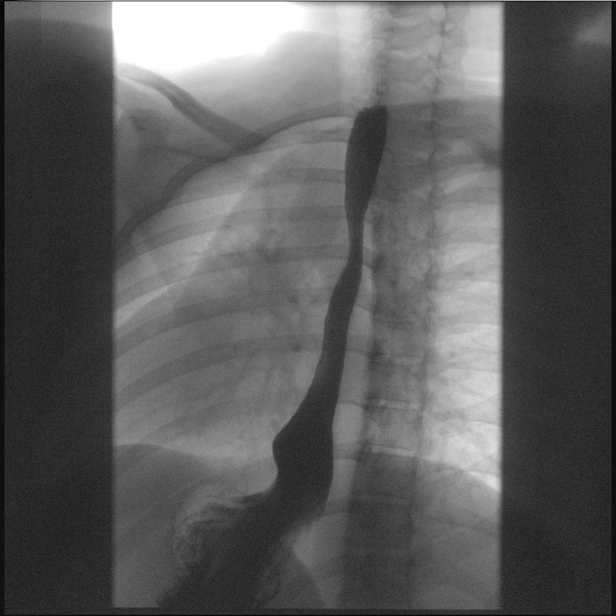
[frame 29/34]
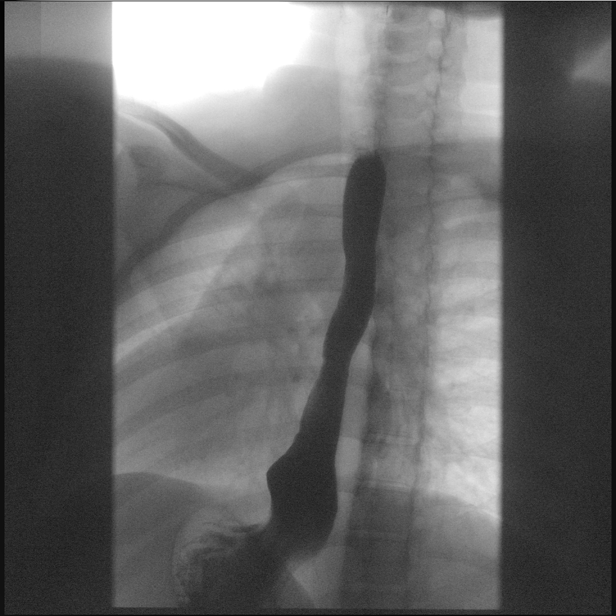

[Series 9: cp_standard · 0.28mm/px · 1 of 1 slices shown (6 of 8)]
[im 1/1]
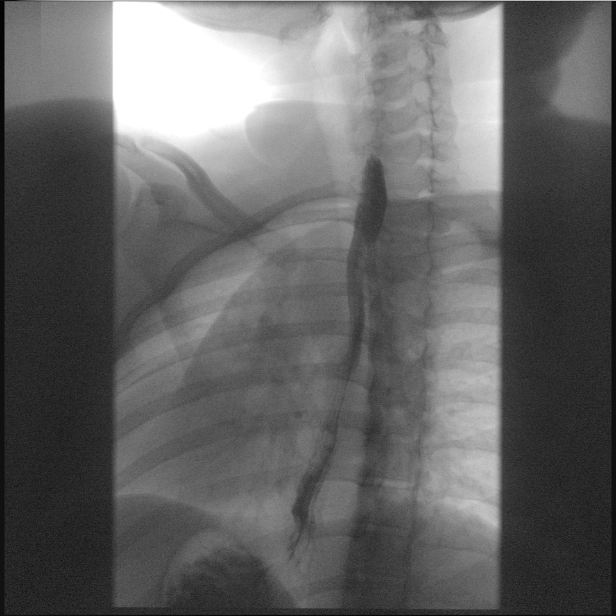

[Series 10: cp_standard · 0.17mm/px · 2 of 72 frames shown (7 of 8)]
[frame 11/72]
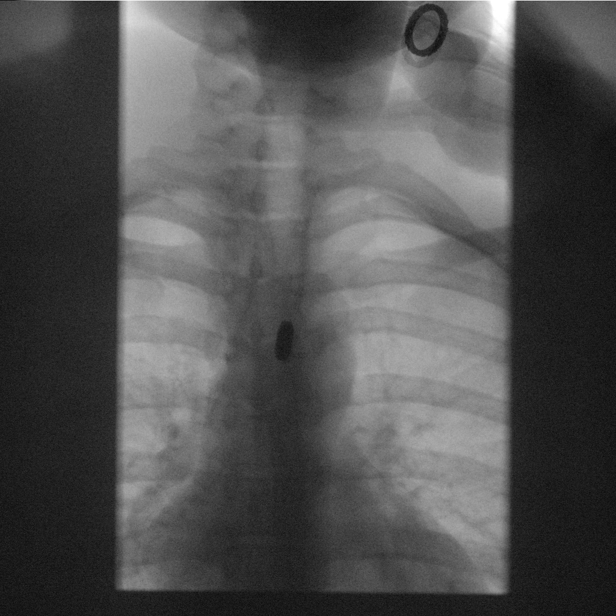
[frame 37/72]
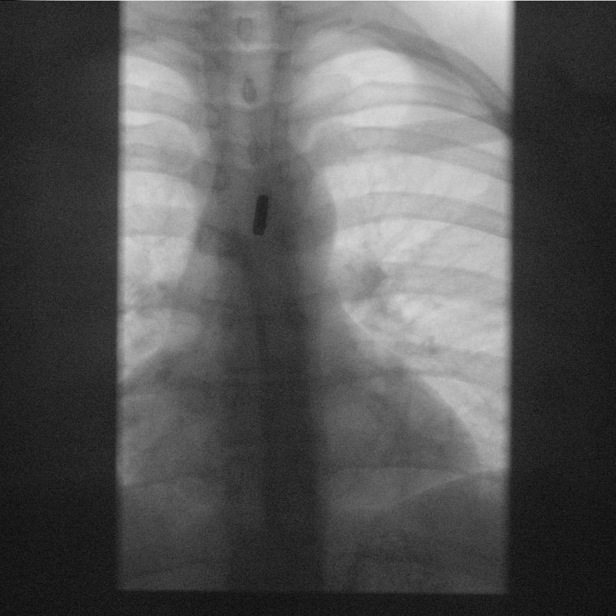

[Series 12: cp_standard · 0.17mm/px · 1 of 1 slices shown (8 of 8)]
[im 1/1]
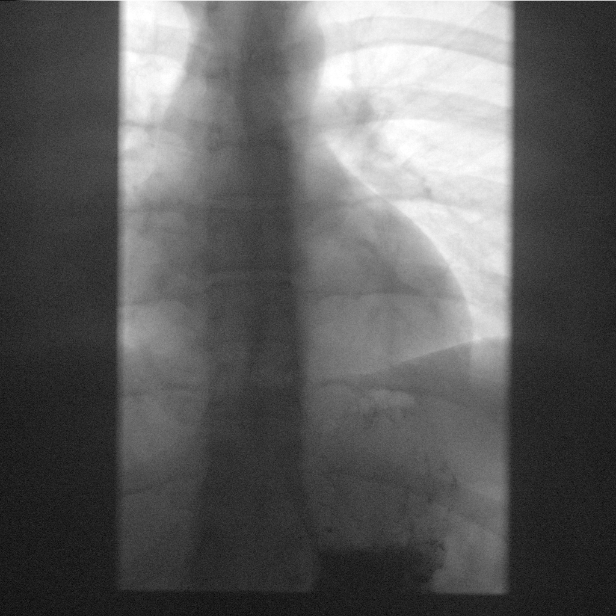

[14 of 24 positions shown; findings below may reference images not displayed]

FINDINGS: Fluoroscopic evaluation demonstrates normal caliber and smooth
contour of the esophagus. No evidence of fixed stricture, mass or
mucosal abnormality.

Normal esophageal motility was observed.

Redemonstrated small hiatal hernia.

Large-volume gastroesophageal reflux was observed to the level of
the upper esophagus.

The patient swallowed a 13 mm barium tablet, which passed freely
into the stomach.
IMPRESSION: Small hiatal hernia.

Large-volume gastroesophageal reflux observed to the level of the
upper esophagus.

Otherwise unremarkable esophagram, described.

## 2024-02-17 ENCOUNTER — Encounter: Payer: Self-pay | Admitting: Surgery

## 2024-02-17 ENCOUNTER — Ambulatory Visit (INDEPENDENT_AMBULATORY_CARE_PROVIDER_SITE_OTHER): Admitting: Surgery

## 2024-02-17 VITALS — BP 119/76 | HR 90 | Temp 98.4°F | Ht 62.0 in | Wt 150.2 lb

## 2024-02-17 DIAGNOSIS — R1013 Epigastric pain: Secondary | ICD-10-CM

## 2024-02-17 DIAGNOSIS — R1900 Intra-abdominal and pelvic swelling, mass and lump, unspecified site: Secondary | ICD-10-CM | POA: Diagnosis not present

## 2024-02-17 NOTE — Patient Instructions (Signed)
 Advised to pursue a goal of 25 to 30 g of fiber daily.  The majority of this may be through natural sources, advised to ensure a minimal daily fiber supplementation.  Various forms of supplements discussed.  Recommend Psyllium husk, with options of mixing with beverage or applesauce to make more tolerable. Strongly advised to consume more fluids(especially in proximity to fiber intake) and to ensure adequate hydration.   Watch color of urine to determine adequacy of hydration.  Clarity is pursued in urine output, and bowel activity that responds and corresponds to significant meal intake.   We need to avoid deferring having bowel movements, advised to take the time at the first sign of sensation, typically following meals, and in the morning.  The need to avoid more frequently, and the presence of flatus may indicate the need for bowel movement.  Do not defer for later.  Addition of MiraLAX (or its generic equivalent) may be needed ensure at least twice daily bowel movements.  If multiple doses of MiraLAX are necessary utilize them. Never skip a day... Do not tolerate a day without a bowel movement unless you are fasting.  To be regular, we must do the above EVERY day.   Soluble Fiber Dissolves in Water: Soluble fiber dissolves in water to form a gel-like substance. Slows Digestion: This type of fiber slows down digestion, which can help control blood sugar levels and lower cholesterol. Sources: Common sources include oats, beans, apples, citrus fruits, and psyllium. Benefits: Helps manage cholesterol levels. Aids in blood sugar control. Increases healthy gut bacteria, which can lower inflammation and improve digestion.  Insoluble Fiber Does Not Dissolve in Water: Insoluble fiber does not dissolve in water and remains mostly intact as it passes through the digestive system. Adds Bulk to Stool: It adds bulk to stool, which helps promote regular bowel movements and prevent constipation. Sources:  Common sources include whole grains, nuts, beans, and vegetables like cauliflower and potatoes. Benefits: Improves bowel health and regularity. Reduces the risk of colorectal conditions like hemorrhoids and diverticulitis. Supports insulin sensitivity in people with diabetes.  Both types of fiber are essential for overall health, and it's beneficial to include a 50/50 mix of both in your diet.

## 2024-02-18 NOTE — Progress Notes (Signed)
 Outpatient Surgical Follow Up    April Foster is an 28 y.o. female.   Chief Complaint  Patient presents with   Follow-up    Abdomen bulging    HPI: April Foster is 3 years out from Nissen fundoplication and repair of paraesophageal hernia I also did a ventral hernia repair last year and she did well.  He does have history of IBS and functional GI issues.  Some diarrhea occasionally.  She comes in today because of some bulging in the upper abdominal wall.  Some intermittent pain in the abdomen.  No fevers no chills.  She is concerned about potential hernia  Past Medical History:  Diagnosis Date   Anxiety    Asthma    Depression    GERD (gastroesophageal reflux disease)    Headache    History of hiatal hernia    Umbilical hernia    Vitamin D deficiency     Past Surgical History:  Procedure Laterality Date   INSERTION OF MESH N/A 03/29/2022   Procedure: INSERTION OF MESH;  Surgeon: Alben Alma, MD;  Location: [facility];  Service: General;  Laterality: N/A;   INSERTION OF MESH N/A 12/11/2022   Procedure: INSERTION OF MESH;  Surgeon: Alben Alma, MD;  Location: [facility];  Service: General;  Laterality: N/A;   KNEE ARTHROSCOPY W/ LATERAL RELEASE Right 2020   TONSILLECTOMY     at 28 years old   VENTRAL HERNIA REPAIR N/A 12/11/2022   Procedure: HERNIA REPAIR VENTRAL ADULT, open;  Surgeon: Alben Alma, MD;  Location: [facility];  Service: General;  Laterality: N/A;   WISDOM TOOTH EXTRACTION     2019   XI ROBOTIC ASSISTED PARAESOPHAGEAL HERNIA REPAIR N/A 03/29/2022   Procedure: XI ROBOTIC ASSISTED PARAESOPHAGEAL HERNIA REPAIR with RNFA to assist;  Surgeon: Alben Alma, MD;  Location: [facility];  Service: General;  Laterality: N/A;    History reviewed. No pertinent family history.  Social History:  reports that she has never smoked. She has never used smokeless tobacco. She reports current alcohol use of about 1.0 standard drink of alcohol per week. She reports that she does not use  drugs.  Allergies:  Allergies  Allergen Reactions   Cholecalciferol Rash   Bupropion Hives   Ibuprofen Nausea And Vomiting and Rash    Medications reviewed.    ROS Full ROS performed and is otherwise negative other than what is stated in HPI   BP 119/76   Pulse 90   Temp 98.4 F (36.9 C) (Oral)   Ht 5\' 2"  (1.575 m)   Wt 150 lb 3.2 oz (68.1 kg)   BMI 27.47 kg/m   Physical Exam Vitals and nursing note reviewed. Exam conducted with a chaperone present.  Constitutional:      Appearance: Normal appearance.  Pulmonary:     Effort: Pulmonary effort is normal.     Breath sounds: No stridor.  Abdominal:     General: Abdomen is flat. There is no distension.     Palpations: Abdomen is soft. There is no mass.     Tenderness: There is no abdominal tenderness. There is no guarding or rebound.     Hernia: No hernia is present.     Comments: Very minimal subxiphoid diastases recti.  There is no evidence of any ventral defects.  Prior robotic incisions healed  Skin:    General: Skin is warm and dry.     Capillary Refill: Capillary refill takes less than 2 seconds.  Neurological:  General: No focal deficit present.     Mental Status: She is alert and oriented to person, place, and time.  Psychiatric:        Mood and Affect: Mood normal.        Behavior: Behavior normal.        Thought Content: Thought content normal.        Judgment: Judgment normal.      Assessment/Plan: 28 year old female with abdominal bulging consistent with diastases recti.  There is no true fascial defects.  Discussed with her in detail about my thought process.  Options of performing CT scan versus just follow-up.  Patient wishes to hold off on CT scan.  No need for surgical intervention.  Please note that I spent 20 minutes in this encounter including personally reviewing prior imaging studies, coordinating her care, placing orders and performing documentation  Evelia Hipp, MD [facility] General  Surgeon

## 2024-02-24 ENCOUNTER — Telehealth: Payer: Self-pay | Admitting: Surgery

## 2024-02-24 NOTE — Telephone Encounter (Signed)
 Patient last saw Dr. Dana Duncan in office on 02/17/24 for abdominal pain and epigastric pain.  Patient is requesting refill on  her Zofran  and also her acid reflux medication.  She uses [facility] [id number] [place] in [place]. Please call patient with status of refills.

## 2024-02-25 ENCOUNTER — Other Ambulatory Visit: Payer: Self-pay

## 2024-02-25 MED ORDER — ONDANSETRON HCL 4 MG PO TABS: 4.0000 mg | ORAL_TABLET | Freq: Three times a day (TID) | ORAL | 0 refills | Status: AC | PRN

## 2024-02-25 MED ORDER — PANTOPRAZOLE SODIUM 40 MG PO TBEC: 40.0000 mg | DELAYED_RELEASE_TABLET | Freq: Every day | ORAL | 0 refills | Status: AC

## 2024-08-19 ENCOUNTER — Encounter: Payer: Self-pay | Admitting: Surgery
